# Patient Record
Sex: Male | Born: 1980 | Race: White | Hispanic: No | Marital: Single | Smoking: Never smoker
Health system: Southern US, Community
[De-identification: ages and names within clinical notes are randomized; demographics above are authoritative.]

## PROBLEM LIST (undated history)

## (undated) HISTORY — PX: BUNIONECTOMY: SHX129

## (undated) HISTORY — PX: HERNIA REPAIR: SHX51

---

## 2009-08-30 ENCOUNTER — Ambulatory Visit (HOSPITAL_BASED_OUTPATIENT_CLINIC_OR_DEPARTMENT_OTHER): Admission: RE | Admit: 2009-08-30 | Discharge: 2009-08-30 | Payer: Self-pay | Admitting: Orthopedic Surgery

## 2010-03-22 ENCOUNTER — Ambulatory Visit (HOSPITAL_BASED_OUTPATIENT_CLINIC_OR_DEPARTMENT_OTHER): Admission: RE | Admit: 2010-03-22 | Discharge: 2010-03-22 | Payer: Self-pay | Admitting: General Surgery

## 2011-02-07 LAB — POCT HEMOGLOBIN-HEMACUE: Hemoglobin: 15.6 g/dL (ref 13.0–17.0)

## 2012-09-05 ENCOUNTER — Ambulatory Visit: Payer: BC Managed Care – PPO

## 2012-10-21 ENCOUNTER — Emergency Department (HOSPITAL_BASED_OUTPATIENT_CLINIC_OR_DEPARTMENT_OTHER)
Admission: EM | Admit: 2012-10-21 | Discharge: 2012-10-21 | Disposition: A | Discharge status: Home / Self Care | Payer: Self-pay | Attending: Emergency Medicine | Admitting: Emergency Medicine

## 2012-10-21 ENCOUNTER — Emergency Department (HOSPITAL_BASED_OUTPATIENT_CLINIC_OR_DEPARTMENT_OTHER): Payer: Self-pay

## 2012-10-21 ENCOUNTER — Encounter (HOSPITAL_BASED_OUTPATIENT_CLINIC_OR_DEPARTMENT_OTHER): Payer: Self-pay | Admitting: *Deleted

## 2012-10-21 DIAGNOSIS — S8000XA Contusion of unspecified knee, initial encounter: Secondary | ICD-10-CM | POA: Insufficient documentation

## 2012-10-21 DIAGNOSIS — S8001XA Contusion of right knee, initial encounter: Secondary | ICD-10-CM

## 2012-10-21 DIAGNOSIS — S7011XA Contusion of right thigh, initial encounter: Secondary | ICD-10-CM

## 2012-10-21 DIAGNOSIS — Y929 Unspecified place or not applicable: Secondary | ICD-10-CM | POA: Insufficient documentation

## 2012-10-21 DIAGNOSIS — S7010XA Contusion of unspecified thigh, initial encounter: Secondary | ICD-10-CM | POA: Insufficient documentation

## 2012-10-21 DIAGNOSIS — Y939 Activity, unspecified: Secondary | ICD-10-CM | POA: Insufficient documentation

## 2012-10-21 DIAGNOSIS — X58XXXA Exposure to other specified factors, initial encounter: Secondary | ICD-10-CM | POA: Insufficient documentation

## 2012-10-21 MED ORDER — HYDROCODONE-ACETAMINOPHEN 5-325 MG PO TABS: 2.0000 | ORAL_TABLET | ORAL | Status: DC | PRN

## 2012-10-21 MED ORDER — IBUPROFEN 800 MG PO TABS: 800.0000 mg | ORAL_TABLET | Freq: Three times a day (TID) | ORAL | Status: DC
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
## 2012-10-25 ENCOUNTER — Encounter: Payer: Self-pay | Admitting: Family Medicine

## 2012-10-25 ENCOUNTER — Ambulatory Visit (INDEPENDENT_AMBULATORY_CARE_PROVIDER_SITE_OTHER): Payer: Self-pay | Admitting: Family Medicine

## 2012-10-25 ENCOUNTER — Ambulatory Visit (HOSPITAL_BASED_OUTPATIENT_CLINIC_OR_DEPARTMENT_OTHER)
Admission: RE | Admit: 2012-10-25 | Discharge: 2012-10-25 | Disposition: A | Discharge status: Home / Self Care | Payer: Self-pay | Source: Ambulatory Visit | Attending: Family Medicine | Admitting: Family Medicine

## 2012-10-25 VITALS — BP 142/91 | Ht 67.0 in | Wt 160.0 lb

## 2012-10-25 DIAGNOSIS — S8991XA Unspecified injury of right lower leg, initial encounter: Secondary | ICD-10-CM

## 2012-10-25 DIAGNOSIS — S99929A Unspecified injury of unspecified foot, initial encounter: Secondary | ICD-10-CM

## 2012-10-25 DIAGNOSIS — M25551 Pain in right hip: Secondary | ICD-10-CM

## 2012-10-25 DIAGNOSIS — M25559 Pain in unspecified hip: Secondary | ICD-10-CM

## 2012-10-25 DIAGNOSIS — S8990XA Unspecified injury of unspecified lower leg, initial encounter: Secondary | ICD-10-CM

## 2012-10-25 MED ORDER — HYDROCODONE-ACETAMINOPHEN 5-325 MG PO TABS: 1.0000 | ORAL_TABLET | Freq: Four times a day (QID) | ORAL | Status: DC | PRN

## 2012-10-25 NOTE — Patient Instructions (Addendum)
I'm concerned you at least tore the meniscus in your right knee and possibly tore your ACL as well. We will go ahead with an MRI of your knee as soon as the cone coverage goes through - call us when it does. In the meantime, elevate, ice 15 minutes at a time 3-4 times a day, wrap with ACE wrap for compression. Knee immobilizer only if needed. Start straight leg raises 3 sets of 10 once a day. Ibuprofen 600mg  three times a day with food for pain and inflammation. Hydrocodone as needed for severe pain beyond this. Call me when the cone coverage goes through (usually 1-2 weeks) and I'll order the MRI.

## 2012-10-25 NOTE — ED Provider Notes (Signed)
Medical screening examination/treatment/procedure(s) were performed by non-physician practitioner and as supervising physician I was immediately available for consultation/collaboration.  Hurman Horn, MD 10/25/12 [phone]

## 2012-10-28 ENCOUNTER — Ambulatory Visit: Payer: Self-pay

## 2012-10-30 ENCOUNTER — Encounter: Payer: Self-pay | Admitting: Family Medicine

## 2012-10-30 DIAGNOSIS — S8991XA Unspecified injury of right lower leg, initial encounter: Secondary | ICD-10-CM | POA: Insufficient documentation

## 2012-10-30 HISTORY — DX: Unspecified injury of right lower leg, initial encounter: S89.91XA

## 2012-10-30 NOTE — Assessment & Plan Note (Signed)
hip radiographs negative as well.  Primary injury is to his right knee where he has an effusion, evidence of medial meniscal tear as well as a probable ACL tear.  Care is limited by lack of insurance - is applying for cone coverage so we can consider MRI.  In meantime, icing, norco, ibuprofen, ace wrap and elevation for swelling.  Start home quad strengthening exercises.    Addendum:  Patient has 65% cone coverage - asked about going ahead with MRI but patient states cost would be too great even with that coverage.  He is going to come back in at 3 weeks for follow-up for reevaluation.

## 2012-10-30 NOTE — Addendum Note (Signed)
Addended by: Kathi Simpers F on: 10/30/2012 03:16 PM   Modules accepted: Orders
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
## 2012-11-02 ENCOUNTER — Ambulatory Visit (HOSPITAL_BASED_OUTPATIENT_CLINIC_OR_DEPARTMENT_OTHER)
Admission: RE | Admit: 2012-11-02 | Discharge: 2012-11-02 | Disposition: A | Discharge status: Home / Self Care | Payer: PRIVATE HEALTH INSURANCE | Source: Ambulatory Visit | Attending: Family Medicine | Admitting: Family Medicine

## 2012-11-02 DIAGNOSIS — S8991XA Unspecified injury of right lower leg, initial encounter: Secondary | ICD-10-CM

## 2012-11-02 DIAGNOSIS — IMO0002 Reserved for concepts with insufficient information to code with codable children: Secondary | ICD-10-CM | POA: Insufficient documentation

## 2012-11-02 DIAGNOSIS — S83509A Sprain of unspecified cruciate ligament of unspecified knee, initial encounter: Secondary | ICD-10-CM | POA: Insufficient documentation

## 2012-11-02 DIAGNOSIS — M25569 Pain in unspecified knee: Secondary | ICD-10-CM | POA: Insufficient documentation

## 2012-11-04 ENCOUNTER — Encounter: Payer: Self-pay | Admitting: Family Medicine

## 2012-11-04 ENCOUNTER — Ambulatory Visit (INDEPENDENT_AMBULATORY_CARE_PROVIDER_SITE_OTHER): Payer: PRIVATE HEALTH INSURANCE | Admitting: Family Medicine

## 2012-11-04 VITALS — BP 127/93 | HR 84 | Ht 68.0 in | Wt 155.0 lb

## 2012-11-04 DIAGNOSIS — S8990XA Unspecified injury of unspecified lower leg, initial encounter: Secondary | ICD-10-CM

## 2012-11-04 DIAGNOSIS — S8991XA Unspecified injury of right lower leg, initial encounter: Secondary | ICD-10-CM

## 2012-11-04 NOTE — Patient Instructions (Addendum)
You have partially torn the PCL of your right knee. After 4-6 weeks you should feel close to 100% as the swelling goes down. Almost everyone does well with the conservative approach to treating this. Ice 15 minutes at a time 3-4 times a day. Knee brace for support and swelling. Elevate leg as much as possible. Ibuprofen OR aleve for pain and inflammation. Start 1-2 visits of physical therapy to get a more extensive physical therapy program. Follow up with me in 2 weeks - hopefully we can return you to work at that time.

## 2012-11-05 ENCOUNTER — Encounter: Payer: Self-pay | Admitting: Family Medicine
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
## 2012-11-05 NOTE — Assessment & Plan Note (Signed)
Patient's MRI is consistent with a partial PCL tear which would explain laxity in AP direction.  This is only partial and no other intraarticular injuries so he should do well with conservative approach.  Start formal PT and a more rigorous home exercise program.  Continue icing, ibuprofen, norco, ace wrap, elevation.  F/u in 2 weeks for reevaluation - hopefully can return him to light duty at that time.

## 2012-11-07 ENCOUNTER — Ambulatory Visit: Payer: PRIVATE HEALTH INSURANCE | Attending: Family Medicine | Admitting: Rehabilitation

## 2012-11-07 DIAGNOSIS — M25569 Pain in unspecified knee: Secondary | ICD-10-CM | POA: Insufficient documentation

## 2012-11-07 DIAGNOSIS — M25669 Stiffness of unspecified knee, not elsewhere classified: Secondary | ICD-10-CM | POA: Insufficient documentation

## 2012-11-07 DIAGNOSIS — R269 Unspecified abnormalities of gait and mobility: Secondary | ICD-10-CM | POA: Insufficient documentation

## 2012-11-07 DIAGNOSIS — IMO0001 Reserved for inherently not codable concepts without codable children: Secondary | ICD-10-CM | POA: Insufficient documentation

## 2012-11-15 ENCOUNTER — Ambulatory Visit: Payer: PRIVATE HEALTH INSURANCE | Admitting: Rehabilitation

## 2012-11-18 ENCOUNTER — Ambulatory Visit: Payer: PRIVATE HEALTH INSURANCE | Admitting: Family Medicine

## 2012-11-19 ENCOUNTER — Ambulatory Visit: Payer: PRIVATE HEALTH INSURANCE | Admitting: Rehabilitation

## 2012-11-21 ENCOUNTER — Encounter: Payer: Self-pay | Admitting: Family Medicine

## 2012-11-21 ENCOUNTER — Ambulatory Visit (INDEPENDENT_AMBULATORY_CARE_PROVIDER_SITE_OTHER): Payer: PRIVATE HEALTH INSURANCE | Admitting: Family Medicine

## 2012-11-21 ENCOUNTER — Ambulatory Visit: Payer: PRIVATE HEALTH INSURANCE | Attending: Family Medicine | Admitting: Rehabilitation

## 2012-11-21 VITALS — BP 119/88 | HR 93 | Ht 68.0 in | Wt 150.0 lb

## 2012-11-21 DIAGNOSIS — S99919A Unspecified injury of unspecified ankle, initial encounter: Secondary | ICD-10-CM

## 2012-11-21 DIAGNOSIS — S8990XA Unspecified injury of unspecified lower leg, initial encounter: Secondary | ICD-10-CM

## 2012-11-21 DIAGNOSIS — S8991XA Unspecified injury of right lower leg, initial encounter: Secondary | ICD-10-CM

## 2012-11-21 DIAGNOSIS — R269 Unspecified abnormalities of gait and mobility: Secondary | ICD-10-CM | POA: Insufficient documentation

## 2012-11-21 DIAGNOSIS — IMO0001 Reserved for inherently not codable concepts without codable children: Secondary | ICD-10-CM | POA: Insufficient documentation

## 2012-11-21 DIAGNOSIS — M25569 Pain in unspecified knee: Secondary | ICD-10-CM | POA: Insufficient documentation

## 2012-11-21 DIAGNOSIS — M25669 Stiffness of unspecified knee, not elsewhere classified: Secondary | ICD-10-CM | POA: Insufficient documentation

## 2012-11-21 NOTE — Patient Instructions (Addendum)
You are doing much better from your right knee PCL partial tear as expected. Start back on light duty for 1 week at work then return to full duty. Continue with physical therapy in the meantime. Follow up with me in 4 weeks - if you're doing well, this will be your last visit.

## 2012-11-22 ENCOUNTER — Encounter: Payer: Self-pay | Admitting: Family Medicine

## 2012-11-22 NOTE — Assessment & Plan Note (Signed)
2/2 partial PCL tear.  Doing extremely well with conservative care as expected.  Will continue with PT.  Tylenol, nsaids as needed.  Start light duty at work - no lifting more than 15 pounds, no crawling, stooping for 1 week then cleared for full duty.  Follow up with me in 4 weeks, call with any concerns sooner.
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
## 2012-11-28 ENCOUNTER — Ambulatory Visit: Payer: PRIVATE HEALTH INSURANCE | Admitting: Rehabilitation

## 2012-12-18 ENCOUNTER — Encounter: Payer: Self-pay | Admitting: Family Medicine

## 2012-12-18 ENCOUNTER — Ambulatory Visit: Payer: PRIVATE HEALTH INSURANCE | Admitting: Family Medicine

## 2012-12-18 ENCOUNTER — Ambulatory Visit (INDEPENDENT_AMBULATORY_CARE_PROVIDER_SITE_OTHER): Payer: PRIVATE HEALTH INSURANCE | Admitting: Family Medicine

## 2012-12-18 VITALS — BP 133/88 | HR 77 | Ht 68.0 in | Wt 155.0 lb

## 2012-12-18 DIAGNOSIS — S99929A Unspecified injury of unspecified foot, initial encounter: Secondary | ICD-10-CM

## 2012-12-18 DIAGNOSIS — S8990XA Unspecified injury of unspecified lower leg, initial encounter: Secondary | ICD-10-CM

## 2012-12-18 DIAGNOSIS — S8991XA Unspecified injury of right lower leg, initial encounter: Secondary | ICD-10-CM

## 2012-12-19 ENCOUNTER — Encounter: Payer: Self-pay | Admitting: Family Medicine
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
## 2012-12-19 NOTE — Assessment & Plan Note (Signed)
2/2 partial PCL tear.  Believe he's at maximum medical improvement with excellent clinical improvement.  Return to full duty now.  F/u prn and call with any concerns.

## 2013-01-04 ENCOUNTER — Other Ambulatory Visit: Payer: Self-pay

## 2013-09-25 ENCOUNTER — Other Ambulatory Visit: Payer: Self-pay

## 2015-03-12 ENCOUNTER — Emergency Department (HOSPITAL_COMMUNITY): Payer: No Typology Code available for payment source

## 2015-03-12 ENCOUNTER — Encounter (HOSPITAL_COMMUNITY): Payer: Self-pay | Admitting: Emergency Medicine

## 2015-03-12 ENCOUNTER — Emergency Department (HOSPITAL_COMMUNITY)
Admission: EM | Admit: 2015-03-12 | Discharge: 2015-03-12 | Disposition: A | Discharge status: Home / Self Care | Payer: No Typology Code available for payment source | Attending: Emergency Medicine | Admitting: Emergency Medicine

## 2015-03-12 DIAGNOSIS — Y998 Other external cause status: Secondary | ICD-10-CM | POA: Diagnosis not present

## 2015-03-12 DIAGNOSIS — Y92410 Unspecified street and highway as the place of occurrence of the external cause: Secondary | ICD-10-CM | POA: Diagnosis not present

## 2015-03-12 DIAGNOSIS — Y9389 Activity, other specified: Secondary | ICD-10-CM | POA: Insufficient documentation

## 2015-03-12 DIAGNOSIS — S199XXA Unspecified injury of neck, initial encounter: Secondary | ICD-10-CM | POA: Diagnosis present

## 2015-03-12 DIAGNOSIS — M542 Cervicalgia: Secondary | ICD-10-CM

## 2015-03-12 DIAGNOSIS — S0990XD Unspecified injury of head, subsequent encounter: Secondary | ICD-10-CM | POA: Diagnosis not present

## 2015-03-12 DIAGNOSIS — R52 Pain, unspecified: Secondary | ICD-10-CM

## 2015-03-12 MED ORDER — IBUPROFEN 200 MG PO TABS
600.0000 mg | ORAL_TABLET | Freq: Once | ORAL | Status: AC
Start: 2015-03-12 — End: 2015-03-12
  Administered 2015-03-12: 600 mg via ORAL
  Filled 2015-03-12: qty 3

## 2015-03-12 MED ORDER — IBUPROFEN 800 MG PO TABS: 800.0000 mg | ORAL_TABLET | Freq: Three times a day (TID) | ORAL | Status: DC | PRN

## 2015-03-12 MED ORDER — CYCLOBENZAPRINE HCL 10 MG PO TABS: 10.0000 mg | ORAL_TABLET | Freq: Three times a day (TID) | ORAL | Status: DC | PRN
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
## 2017-11-23 ENCOUNTER — Telehealth: Payer: Self-pay | Admitting: General Practice

## 2017-11-23 NOTE — Telephone Encounter (Signed)
Copied from CRM [phone]. Topic: Quick Communication - See Telephone Encounter >> Nov 23, 2017  4:54 PM Rudi CocoLathan, Jeananne Bedwell M, NT wrote: CRM for notification. See Telephone encounter for:   11/23/17. Pt. Called wanting to speak with [facility] transferred him to vm pt. Hung up. Called pt. Back to transfer him to NT but no answer.

## 2017-11-23 NOTE — Telephone Encounter (Signed)
I am sorry I don't know what the abbreviation for BHP and NT mean.

## 2017-12-27 ENCOUNTER — Ambulatory Visit: Payer: Self-pay | Admitting: Psychology

## 2021-03-07 ENCOUNTER — Ambulatory Visit (HOSPITAL_BASED_OUTPATIENT_CLINIC_OR_DEPARTMENT_OTHER): Payer: PRIVATE HEALTH INSURANCE | Admitting: Nurse Practitioner

## 2021-03-14 ENCOUNTER — Other Ambulatory Visit: Payer: Self-pay

## 2021-03-14 ENCOUNTER — Emergency Department (HOSPITAL_BASED_OUTPATIENT_CLINIC_OR_DEPARTMENT_OTHER): Payer: BC Managed Care – PPO | Admitting: Radiology

## 2021-03-14 ENCOUNTER — Ambulatory Visit (INDEPENDENT_AMBULATORY_CARE_PROVIDER_SITE_OTHER): Payer: BC Managed Care – PPO | Admitting: Nurse Practitioner

## 2021-03-14 ENCOUNTER — Emergency Department (HOSPITAL_BASED_OUTPATIENT_CLINIC_OR_DEPARTMENT_OTHER)
Admission: EM | Admit: 2021-03-14 | Discharge: 2021-03-14 | Disposition: A | Discharge status: Home / Self Care | Payer: BC Managed Care – PPO | Attending: Emergency Medicine | Admitting: Emergency Medicine

## 2021-03-14 ENCOUNTER — Encounter (HOSPITAL_BASED_OUTPATIENT_CLINIC_OR_DEPARTMENT_OTHER): Payer: Self-pay | Admitting: Nurse Practitioner

## 2021-03-14 ENCOUNTER — Other Ambulatory Visit (HOSPITAL_BASED_OUTPATIENT_CLINIC_OR_DEPARTMENT_OTHER)
Admission: RE | Admit: 2021-03-14 | Discharge: 2021-03-14 | Disposition: A | Discharge status: Home / Self Care | Payer: BC Managed Care – PPO | Source: Ambulatory Visit | Attending: Nurse Practitioner | Admitting: Nurse Practitioner

## 2021-03-14 ENCOUNTER — Encounter (HOSPITAL_BASED_OUTPATIENT_CLINIC_OR_DEPARTMENT_OTHER): Payer: Self-pay | Admitting: Emergency Medicine

## 2021-03-14 VITALS — BP 127/97 | HR 63 | Ht 67.0 in | Wt 137.0 lb

## 2021-03-14 DIAGNOSIS — R079 Chest pain, unspecified: Secondary | ICD-10-CM | POA: Diagnosis not present

## 2021-03-14 DIAGNOSIS — Z9109 Other allergy status, other than to drugs and biological substances: Secondary | ICD-10-CM

## 2021-03-14 DIAGNOSIS — Z0000 Encounter for general adult medical examination without abnormal findings: Secondary | ICD-10-CM

## 2021-03-14 DIAGNOSIS — R058 Other specified cough: Secondary | ICD-10-CM

## 2021-03-14 DIAGNOSIS — J9801 Acute bronchospasm: Secondary | ICD-10-CM

## 2021-03-14 DIAGNOSIS — Z7689 Persons encountering health services in other specified circumstances: Secondary | ICD-10-CM | POA: Diagnosis not present

## 2021-03-14 DIAGNOSIS — R55 Syncope and collapse: Secondary | ICD-10-CM

## 2021-03-14 DIAGNOSIS — R059 Cough, unspecified: Secondary | ICD-10-CM | POA: Diagnosis not present

## 2021-03-14 DIAGNOSIS — R0789 Other chest pain: Secondary | ICD-10-CM | POA: Diagnosis not present

## 2021-03-14 DIAGNOSIS — R053 Chronic cough: Secondary | ICD-10-CM | POA: Diagnosis not present

## 2021-03-14 LAB — COMPREHENSIVE METABOLIC PANEL
ALT: 17 U/L (ref 0–44)
AST: 21 U/L (ref 15–41)
Albumin: 4.7 g/dL (ref 3.5–5.0)
Alkaline Phosphatase: 55 U/L (ref 38–126)
Anion gap: 3 — ABNORMAL LOW (ref 5–15)
BUN: 19 mg/dL (ref 6–20)
CO2: 33 mmol/L — ABNORMAL HIGH (ref 22–32)
Calcium: 9.8 mg/dL (ref 8.9–10.3)
Chloride: 101 mmol/L (ref 98–111)
Creatinine, Ser: 0.85 mg/dL (ref 0.61–1.24)
GFR, Estimated: >60 mL/min (ref >60)
Glucose, Bld: 102 mg/dL — ABNORMAL HIGH (ref 70–99)
Potassium: 4 mmol/L (ref 3.5–5.1)
Sodium: 137 mmol/L (ref 135–145)
Total Bilirubin: 0.6 mg/dL (ref 0.3–1.2)
Total Protein: 7.5 g/dL (ref 6.5–8.1)

## 2021-03-14 LAB — CBC WITH DIFFERENTIAL/PLATELET
Abs Immature Granulocytes: 0.01 10*3/uL (ref 0.00–0.07)
Basophils Absolute: 0 10*3/uL (ref 0.0–0.1)
Basophils Relative: 1 %
Eosinophils Absolute: 0.1 10*3/uL (ref 0.0–0.5)
Eosinophils Relative: 2 %
HCT: 42.7 % (ref 39.0–52.0)
Hemoglobin: 14.4 g/dL (ref 13.0–17.0)
Immature Granulocytes: 0 %
Lymphocytes Relative: 39 %
Lymphs Abs: 1.3 10*3/uL (ref 0.7–4.0)
MCH: 30.6 pg (ref 26.0–34.0)
MCHC: 33.7 g/dL (ref 30.0–36.0)
MCV: 90.7 fL (ref 80.0–100.0)
Monocytes Absolute: 0.3 10*3/uL (ref 0.1–1.0)
Monocytes Relative: 8 %
Neutro Abs: 1.7 10*3/uL (ref 1.7–7.7)
Neutrophils Relative %: 50 %
Platelets: 228 10*3/uL (ref 150–400)
RBC: 4.71 MIL/uL (ref 4.22–5.81)
RDW: 12.8 % (ref 11.5–15.5)
WBC: 3.4 10*3/uL — ABNORMAL LOW (ref 4.0–10.5)
nRBC: 0 % (ref 0.0–0.2)

## 2021-03-14 LAB — LIPID PANEL
Cholesterol: 209 mg/dL — ABNORMAL HIGH (ref 0–200)
HDL: 88 mg/dL (ref >40)
LDL Cholesterol: 104 mg/dL — ABNORMAL HIGH (ref 0–99)
Total CHOL/HDL Ratio: 2.4 RATIO
Triglycerides: 84 mg/dL (ref <150)
VLDL: 17 mg/dL (ref 0–40)

## 2021-03-14 MED ORDER — MONTELUKAST SODIUM 10 MG PO TABS: 10.0000 mg | ORAL_TABLET | Freq: Every day | ORAL | 3 refills | Status: DC

## 2021-03-14 MED ORDER — ALBUTEROL SULFATE HFA 108 (90 BASE) MCG/ACT IN AERS: 1.0000 | INHALATION_SPRAY | RESPIRATORY_TRACT | 1 refills | Status: DC | PRN

## 2021-03-14 MED ORDER — FLUTICASONE PROPIONATE 50 MCG/ACT NA SUSP: 2.0000 | Freq: Every day | NASAL | 0 refills | Status: DC

## 2021-03-14 NOTE — Assessment & Plan Note (Signed)
Review of medical history, social history, and family history performed.  Unsure on vaccination status for flu and tdap- will review medical records to determine needs.  Acute issues addressed today.  CPE due with labs. Will obtain lab panel for evaluation and review at upcoming CPE.

## 2021-03-14 NOTE — Assessment & Plan Note (Signed)
Cough in the setting of suspected allergies and reactive airway disease.  Treatment for allergens and asthma symptoms provided.  If no improvement, consider chest x-ray and further evaluation.  Very unlikely he will be able to leave the home.  Protective measures recommended during clean up process and air out of the home recommended.  Will follow-up in 2-4 weeks for re-evaluation.

## 2021-03-14 NOTE — Assessment & Plan Note (Signed)
Symptoms and presentation consistent with suspected reactive airway disorder that started shortly after moving into older home with known mold issues.  Strongly suspect symptoms are related to allergens and mold exposure.  At this time will start treatment with albuterol PRN and montelukast for reactive airway and allergy symptoms.  Recommend closing off area with mold present and wearing protective masks when working around areas with mold growth. It is not likely that he will be able to leave this environment at this time, therefore, hopeful that symptoms can be controlled with treatment.  No signs of infection present and no concerns for respiratory dysfunction.  Very mild wheeze auscultate in RLL on expiration with clear lung sounds otherwise.  Presence of cobblestoning and drainage in posterior pharynx solidifies suspicions of allergy.  Recommend follow-up in 2-4 weeks.

## 2021-03-14 NOTE — ED Notes (Signed)
Patient verbalizes understanding of discharge instructions. Opportunity for questioning and answers were provided. Armband removed by staff, pt discharged from ED.  

## 2021-03-14 NOTE — Assessment & Plan Note (Addendum)
Strong suspicion of allergies related to home environment with known mold presence (currently working to remove) and recent removal of old carpeting.  It is highly unlikely that he will be able to leave the environment, therefore will work to reduce symptoms and protect him as much as possible until problems are eliminated.  No signs of infection or serious respiratory compromise present.  Start montelukast at bedtime and flonase once a day with PRN albuterol for shortness of breath and wheezing.  Recommend protective mask when working with mold and keeping the area closed off, if possible. Suggest opening the window on clear days in the room to allow for air flow to help clear some contaminants.  No central heat and hair to spread contaminants into other areas at this time.  If symptoms do not improve or worsen, recommend alternative living environment to see if symptoms clear to be sure that the home is what is causing the issues.  Chest x-ray in 2-4 weeks if symptoms not improved. Labs today.  Recommend follow-up in 2-4 weeks.

## 2021-03-14 NOTE — Discharge Instructions (Signed)
Recommend following up with your primary care doctor.  If you have any additional episodes of passing out, any chest pain or difficulty breathing, or other new concerning symptom, return to ER for reassessment.

## 2021-03-14 NOTE — Patient Instructions (Addendum)
Recommendations from today's visit:  Your cough is most likely from something called Reactive Airway Disease which is related to allergies and asthma and causes increased cough and spasms in your airways.  I have called in medicine for you to start taking every day to see if this will help.  You will take the montelukast (Singulair) at bedtime every night. Make sure to take this before you go to sleep, because it can make you sleepy.  You will use 2 sprays of fluticasone (Flonase) in each nostril every day. You can do this in the morning or the evening, whichever is better for you.  You will use the inhaler if you feel short of breath or like your lungs are wheezing. You can use this up to every 4 hours, but only use it if you need it.   I want to get some lab work today to look at your white blood counts and make sure they are OK. I will also check your cholesterol and kidney function, liver function, and electrolytes.   I would like you to come back in 4 weeks- this medication can take about that long to give you the full effect. In 4 weeks we will do a physical exam and see how you are feeling and if the medication is working. If it is not, then we will plan to get an X-ray of your chest.    ____________________________________________ Thank you for choosing [facility] for your Primary Care needs. I am excited for the opportunity to partner with you to meet your health care goals. It was a pleasure meeting you today!  I am an Adult-Geriatric Nurse Practitioner with a background in caring for patients for more than 20 years. I received my Paediatric nurse in Nursing and my Doctor of Nursing Practice degrees at [employer]. I received additional fellowship training in primary care and sports medicine after receiving my doctorate degree. I provide primary care and sports medicine services to patients age 81 and older within this office. I am also a provider with the  [facility] and the director of the APP Fellowship with [facility].  I am a [place] native, but have called the [place] area home for nearly 20 years and am proud to be a member of this community.   I am passionate about providing the best service to you through preventive medicine and supportive care. I consider you a part of the medical team and value your input. I work diligently to ensure that you are heard and your needs are met in a safe and effective manner. I want you to feel comfortable with me as your provider and want you to know that your health concerns are important to me.   For your information, our office hours are Monday- Friday 8:00 AM - 5:00 PM At this time I am not in the office on Wednesdays.  If you have questions or concerns, please call our office at [phone] or send [place] a MyChart message and we will respond as quickly as possible.   For all urgent or time sensitive needs we ask that you please call the office to avoid delays. MyChart is not constantly monitored and replies may take up to 72 business hours.  MyChart Policy: . MyChart allows for you to see your visit notes, after visit summary, provider recommendations, lab and tests results, make an appointment, request refills, and contact your provider or the office  for non-urgent questions or concerns.  . Providers are seeing patients during normal business hours and do not have built in time to review MyChart messages. We ask that you allow a minimum of 72 business hours for MyChart message responses.  . Complex MyChart concerns may require a visit. Your provider may request you schedule a virtual or in person visit to ensure we are providing the best care possible. . MyChart messages sent after 4:00 PM on Friday will not be received by the provider until Monday morning.    Lab and Test Results: . You will receive your lab and test results on MyChart as soon as  they are completed and results have been sent by the lab or testing facility. Due to this service, you will receive your results BEFORE your provider.  . Please allow a minimum of 72 business hours for your provider to receive and review lab and test results and contact you about.   . Most lab and test result comments from the provider will be sent through [facility]. Your provider may recommend changes to the plan of care, follow-up visits, repeat testing, ask questions, or request an office visit to discuss these results. You may reply directly to this message or call the office at [phone] to provide information for the provider or set up an appointment. . In some instances, you will be called with test results and recommendations. Please let us know if this is preferred and we will make note of this in your chart to provide this for you.    . If you have not heard a response to your lab or test results in 72 business hours, please call the office to let us know.   After Hours: . For all non-emergency after hours needs, please call the office at [phone] and select the option to reach the on-call provider service. On-call services are shared between multiple [facility] offices and therefore it will not be possible to speak directly with your provider. On-call providers may provide medical advice and recommendations, but are unable to provide refills for maintenance medications.  . For all emergency or urgent medical needs after normal business hours, we recommend that you seek care at the closest Urgent Care or Emergency Department to ensure appropriate treatment in a timely manner.  [facility] has a 24 hour emergency room located on the ground floor for your convenience.    Please do not hesitate to reach out to [place] with concerns.   Thank you, again, for choosing me as your health care partner. I appreciate your trust and look forward to learning more about you.    Worthy Keeler, DNP, AGNP-c ___________________________________________________________________________________

## 2021-03-14 NOTE — ED Provider Notes (Signed)
[facility] EMERGENCY DEPT Provider Note   CSN: [id number] Arrival date & time: 03/14/21  1458     History Chief Complaint  Patient presents with  . Chest Pain    Victor King is a 40 y.o. male.  Presents to ER after near syncopal episode.  Patient reports that he was at his primary care office to establish care and to discuss chronic cough.  While obtaining blood work, he states that he felt very lightheaded and almost passed out.  States that these symptoms relatively quickly resolved and he has no ongoing symptoms at present.  Reports that his cough is chronic, nonproductive, no recent changes.  No fevers or chills.  No associated chest pain or difficulty in breathing.  Denies prior episodes of passing out.  Non-smoker.  HPI     History reviewed. No pertinent past medical history.  Patient Active Problem List   Diagnosis Date Noted  . Encounter to establish care 03/14/2021  . Allergic cough 03/14/2021  . Bronchospasm 03/14/2021  . Allergy to environmental factors 03/14/2021  . Right leg injury 10/30/2012  . Right knee injury 10/30/2012    Past Surgical History:  Procedure Laterality Date  . BUNIONECTOMY    . HERNIA REPAIR         Family History  Problem Relation Age of Onset  . Sudden death Neg Hx   . Hypertension Neg Hx   . Hyperlipidemia Neg Hx   . Heart attack Neg Hx   . Diabetes Neg Hx     Social History   Tobacco Use  . Smoking status: Never Smoker  . Smokeless tobacco: Never Used  Substance Use Topics  . Alcohol use: No  . Drug use: No    Home Medications Prior to Admission medications   Medication Sig Start Date End Date Taking? Authorizing Provider  albuterol (VENTOLIN HFA) 108 (90 Base) MCG/ACT inhaler Inhale 1-2 puffs into the lungs every 4 (four) hours as needed for wheezing. 03/14/21   Early, Sung Amabile, NP  fluticasone (FLONASE) 50 MCG/ACT nasal spray Place 2 sprays into both nostrils daily. 03/14/21   Tollie Eth, NP   montelukast (SINGULAIR) 10 MG tablet Take 1 tablet (10 mg total) by mouth at bedtime. 03/14/21   Tollie Eth, NP    Allergies    Patient has no known allergies.  Review of Systems   Review of Systems  Constitutional: Negative for chills and fever.  HENT: Negative for ear pain and sore throat.   Eyes: Negative for pain and visual disturbance.  Respiratory: Positive for cough. Negative for shortness of breath.   Cardiovascular: Negative for chest pain and palpitations.  Gastrointestinal: Negative for abdominal pain and vomiting.  Genitourinary: Negative for dysuria and hematuria.  Musculoskeletal: Negative for arthralgias and back pain.  Skin: Negative for color change and rash.  Neurological: Positive for syncope. Negative for seizures.  All other systems reviewed and are negative.   Physical Exam Updated Vital Signs BP (!) 133/91 (BP Location: Left Arm)   Pulse (!) 58   Temp 98.5 F (36.9 C) (Oral)   Resp 20   Ht 5\' 7"  (1.702 m)   Wt 62.1 kg   SpO2 100%   BMI 21.44 kg/m   Physical Exam Vitals and nursing note reviewed.  Constitutional:      Appearance: He is well-developed.  HENT:     Head: Normocephalic and atraumatic.  Eyes:     Conjunctiva/sclera: Conjunctivae normal.  Cardiovascular:  Rate and Rhythm: Normal rate and regular rhythm.     Heart sounds: No murmur heard.   Pulmonary:     Effort: Pulmonary effort is normal. No respiratory distress.     Breath sounds: Normal breath sounds.  Abdominal:     Palpations: Abdomen is soft.     Tenderness: There is no abdominal tenderness.  Musculoskeletal:     Cervical back: Neck supple.  Skin:    General: Skin is warm and dry.  Neurological:     Mental Status: He is alert.     ED Results / Procedures / Treatments   Labs (all labs ordered are listed, but only abnormal results are displayed) Labs Reviewed - No data to display  EKG EKG Interpretation  Date/Time:  Monday March 14 2021 17:01:56  EDT Ventricular Rate:  59 PR Interval:  152 QRS Duration: 106 QT Interval:  400 QTC Calculation: 396 R Axis:   74 Text Interpretation: Sinus bradycardia with sinus arrhythmia Minimal voltage criteria for LVH, may be normal variant ( Sokolow-Lyon ) Borderline ECG Confirmed by Marianna Fuss ([id number]) on 03/14/2021 5:13:24 PM   Radiology No results found.  Procedures Procedures   Medications Ordered in ED Medications - No data to display  ED Course  I have reviewed the triage vital signs and the nursing notes.  Pertinent labs & imaging results that were available during my care of the patient were reviewed by me and considered in my medical decision making (see chart for details).    MDM Rules/Calculators/A&P                          40 year old male presents to ER with concern for near syncopal episode while obtaining lab work.  On exam today patient is remarkably well-appearing in no distress with stable vital signs.  EKG unremarkable, basic labs that were performed earlier today were grossly normal.  CXR negative.  Recommend follow-up with primary doctor regarding this episode and his chronic cough.   After the discussed management above, the patient was determined to be safe for discharge.  The patient was in agreement with this plan and all questions regarding their care were answered.  ED return precautions were discussed and the patient will return to the ED with any significant worsening of condition.  Final Clinical Impression(s) / ED Diagnoses Final diagnoses:  Near syncope    Rx / DC Orders ED Discharge Orders    None       Milagros Loll, MD 03/14/21 1743

## 2021-03-14 NOTE — ED Triage Notes (Signed)
Pt from PCP on 3rd floor. He was seen for coughing/chest pain since November. During his visit, he was prescribed singular and flonase and sent for lab work. Pt had near syncopal episode during lab draw and was brought to the ED. Pt is clearing his throat every few seconds in triage. States he has no other symptoms. Pt alert & oriented, nad noted.

## 2021-03-14 NOTE — Progress Notes (Signed)
New Patient Office Visit  Subjective:  Patient ID: Victor King, male    DOB: 1981-02-22  Age: 40 y.o. MRN: [id number]  CC:  Chief Complaint  Patient presents with  . Establish Care    HPI Victor King presents to establish care with this practice.  He reports he has not seen a PCP since he was a child.   He states today that he has concerns with a "tickle in my throat" and cough and he needs to clear throat constantly that has been going on since about November. He reports in October of last year he moved into his fathers home after he passed away. He tells me that he and his brother removed old carpeting and did find an area of mold damage in the master bedroom. They are in the process of repairing the flooring and joists from the damage This is the only area of mold in the home that he is aware of. There is no central heat of air- oil space heaters utilized for heat in rooms and window unit for [facility]. They have not opened windows or had the home professionally evaluated for mold damage. They are doing the work themselves.    He endorses chronic constant cough, intermittent sore throat, intermittent shortness of breath, and a sensation of "back of my throat clogged". He denies fever, chills, sinus pain or pressure, ear pain or pressure, rhinorrhea, wheezing, gerd or reflux symptoms, allergies, congestion.   He lives with his brother. He works at [employer] in [place].  He denies etoh, nicotine, or recreational drug use.  He is not currently sexually active. Denies concerns for STI and declines testing today.  He denies any regular medication use or medial history. He reports he feels safe in his home and environment.  He states that his mother had heart disease and his father had cancer of unknown type. Both have passed away.  He has had his COVID vaccines, he is unsure about a flu vaccine or Tdap.    Past Surgical History:  Procedure Laterality Date  . BUNIONECTOMY    .  HERNIA REPAIR      ROS Review of Systems All review of systems negative except what is listed in the HPI  Objective:   Today's Vitals: BP (!) 127/97   Pulse 63   Ht 5\' 7"  (1.702 m)   Wt 137 lb (62.1 kg)   SpO2 100%   BMI 21.46 kg/m   Physical Exam Vitals and nursing note reviewed.  Constitutional:      Appearance: Normal appearance. He is normal weight.  HENT:     Head: Normocephalic and atraumatic.     Right Ear: Hearing, ear canal and external ear normal. A middle ear effusion is present. Tympanic membrane is retracted.     Left Ear: Hearing, ear canal and external ear normal. A middle ear effusion is present. Tympanic membrane is retracted.     Nose: Mucosal edema and congestion present.     Right Turbinates: Swollen and pale.     Left Turbinates: Swollen and pale.     Right Sinus: No maxillary sinus tenderness or frontal sinus tenderness.     Left Sinus: No maxillary sinus tenderness or frontal sinus tenderness.     Mouth/Throat:     Mouth: Mucous membranes are moist.     Pharynx: Posterior oropharyngeal erythema present.     Comments: Cobblestone appears to posterior pharynx with moderate amount of drainage present.  Eyes:  Extraocular Movements: Extraocular movements intact.     Conjunctiva/sclera: Conjunctivae normal.     Pupils: Pupils are equal, round, and reactive to light.  Neck:     Vascular: No carotid bruit.  Cardiovascular:     Rate and Rhythm: Normal rate and regular rhythm.     Pulses: Normal pulses.     Heart sounds: Normal heart sounds. No murmur heard.   Pulmonary:     Effort: Pulmonary effort is normal.     Breath sounds: Wheezing present.  Abdominal:     General: Abdomen is flat. Bowel sounds are normal. There is no distension.     Palpations: Abdomen is soft.     Tenderness: There is no abdominal tenderness. There is no right CVA tenderness, left CVA tenderness, guarding or rebound.  Musculoskeletal:        General: Normal range of  motion.     Cervical back: Normal range of motion and neck supple. No tenderness.     Right lower leg: No edema.     Left lower leg: No edema.  Lymphadenopathy:     Cervical: No cervical adenopathy.  Skin:    General: Skin is warm and dry.     Capillary Refill: Capillary refill takes less than 2 seconds.  Neurological:     General: No focal deficit present.     Mental Status: He is alert and oriented to person, place, and time. Mental status is at baseline.     Cranial Nerves: No cranial nerve deficit.     Sensory: No sensory deficit.     Motor: No weakness.     Coordination: Coordination normal.     Gait: Gait normal.     Deep Tendon Reflexes: Reflexes normal.  Psychiatric:        Behavior: Behavior normal.        Thought Content: Thought content normal.        Judgment: Judgment normal.       Assessment & Plan:   Problem List Items Addressed This Visit      Other   Encounter to establish care - Primary    Review of medical history, social history, and family history performed.  Unsure on vaccination status for flu and tdap- will review medical records to determine needs.  Acute issues addressed today.  CPE due with labs. Will obtain lab panel for evaluation and review at upcoming CPE.        Relevant Orders   Comprehensive metabolic panel (Completed)   Lipid panel   Allergic cough    Cough in the setting of suspected allergies and reactive airway disease.  Treatment for allergens and asthma symptoms provided.  If no improvement, consider chest x-ray and further evaluation.  Very unlikely he will be able to leave the home.  Protective measures recommended during clean up process and air out of the home recommended.  Will follow-up in 2-4 weeks for re-evaluation.       Relevant Medications   montelukast (SINGULAIR) 10 MG tablet   fluticasone (FLONASE) 50 MCG/ACT nasal spray   Other Relevant Orders   CBC with Differential (Completed)   Bronchospasm    Symptoms  and presentation consistent with suspected reactive airway disorder that started shortly after moving into older home with known mold issues.  Strongly suspect symptoms are related to allergens and mold exposure.  At this time will start treatment with albuterol PRN and montelukast for reactive airway and allergy symptoms.  Recommend closing off area with mold  present and wearing protective masks when working around areas with mold growth. It is not likely that he will be able to leave this environment at this time, therefore, hopeful that symptoms can be controlled with treatment.  No signs of infection present and no concerns for respiratory dysfunction.  Very mild wheeze auscultate in RLL on expiration with clear lung sounds otherwise.  Presence of cobblestoning and drainage in posterior pharynx solidifies suspicions of allergy.  Recommend follow-up in 2-4 weeks.      Relevant Medications   montelukast (SINGULAIR) 10 MG tablet   albuterol (VENTOLIN HFA) 108 (90 Base) MCG/ACT inhaler   Other Relevant Orders   CBC with Differential (Completed)   Allergy to environmental factors    Strong suspicion of allergies related to home environment with known mold presence (currently working to remove) and recent removal of old carpeting.  It is highly unlikely that he will be able to leave the environment, therefore will work to reduce symptoms and protect him as much as possible until problems are eliminated.  No signs of infection or serious respiratory compromise present.  Start montelukast at bedtime and flonase once a day with PRN albuterol for shortness of breath and wheezing.  Recommend protective mask when working with mold and keeping the area closed off, if possible. Suggest opening the window on clear days in the room to allow for air flow to help clear some contaminants.  No central heat and hair to spread contaminants into other areas at this time.  If symptoms do not improve or worsen,  recommend alternative living environment to see if symptoms clear to be sure that the home is what is causing the issues.  Chest x-ray in 2-4 weeks if symptoms not improved. Labs today.  Recommend follow-up in 2-4 weeks.       Relevant Orders   CBC with Differential (Completed)    Other Visit Diagnoses    Routine health maintenance       Relevant Orders   Comprehensive metabolic panel (Completed)   Lipid panel      Outpatient Encounter Medications as of 03/14/2021  Medication Sig  . albuterol (VENTOLIN HFA) 108 (90 Base) MCG/ACT inhaler Inhale 1-2 puffs into the lungs every 4 (four) hours as needed for wheezing.  . fluticasone (FLONASE) 50 MCG/ACT nasal spray Place 2 sprays into both nostrils daily.  . montelukast (SINGULAIR) 10 MG tablet Take 1 tablet (10 mg total) by mouth at bedtime.  . [DISCONTINUED] cyclobenzaprine (FLEXERIL) 10 MG tablet Take 1 tablet (10 mg total) by mouth 3 (three) times daily as needed for muscle spasms (and pain).  . [DISCONTINUED] ibuprofen (ADVIL,MOTRIN) 800 MG tablet Take 1 tablet (800 mg total) by mouth every 8 (eight) hours as needed for mild pain or moderate pain.   No facility-administered encounter medications on file as of 03/14/2021.    Follow-up: Return in about 4 weeks (around 04/11/2021) for CPE and Allergy F/U- Labs today.   Tollie Eth, NP

## 2021-03-17 NOTE — Progress Notes (Signed)
Cholesterol slightly elevated- HDL is very good. Recommend decrease saturated fats and include 20 minute brisk walks every day to help see if we can get this down a little.

## 2021-03-23 ENCOUNTER — Telehealth (HOSPITAL_BASED_OUTPATIENT_CLINIC_OR_DEPARTMENT_OTHER): Payer: Self-pay

## 2021-03-23 NOTE — Telephone Encounter (Signed)
Called patient to go over lab results. Patient did not answer and has a mailbox that is not set up and can not accept any messages. Will attempt to call patient later.

## 2021-03-23 NOTE — Telephone Encounter (Signed)
Called patient to go over lab results. Patient did not answer and has a mailbox that is not set up and can not accept any messages. Will attempt to call patient later.  

## 2021-03-23 NOTE — Telephone Encounter (Signed)
-----   Message from Tollie Eth, NP sent at 03/17/2021  8:09 AM EDT ----- Cholesterol slightly elevated- HDL is very good. Recommend decrease saturated fats and include 20 minute brisk walks every day to help see if we can get this down a little.

## 2021-04-11 ENCOUNTER — Ambulatory Visit (HOSPITAL_BASED_OUTPATIENT_CLINIC_OR_DEPARTMENT_OTHER): Payer: BC Managed Care – PPO | Admitting: Nurse Practitioner

## 2021-04-11 DIAGNOSIS — Z0000 Encounter for general adult medical examination without abnormal findings: Secondary | ICD-10-CM | POA: Insufficient documentation

## 2021-04-11 DIAGNOSIS — H6690 Otitis media, unspecified, unspecified ear: Secondary | ICD-10-CM

## 2021-04-11 DIAGNOSIS — H7290 Unspecified perforation of tympanic membrane, unspecified ear: Secondary | ICD-10-CM

## 2021-04-11 HISTORY — DX: Otitis media, unspecified, unspecified ear: H66.90

## 2021-04-11 HISTORY — DX: Unspecified perforation of tympanic membrane, unspecified ear: H72.90

## 2021-04-11 NOTE — Progress Notes (Deleted)
There were no vitals taken for this visit.   Subjective:    Patient ID: Victor King, male    DOB: 02-Jun-1981, 40 y.o.   MRN: [id number]  HPI: Victor King is a 40 y.o. male presenting on 04/11/2021 for comprehensive medical examination.   Current medical concerns include:{Blank single:19197::"none","***"}  Past Medical History:  Past Medical History:  Diagnosis Date  . Right knee injury 10/30/2012  . Right leg injury 10/30/2012    Medications:  Current Outpatient Medications on File Prior to Visit  Medication Sig  . albuterol (VENTOLIN HFA) 108 (90 Base) MCG/ACT inhaler Inhale 1-2 puffs into the lungs every 4 (four) hours as needed for wheezing.  . fluticasone (FLONASE) 50 MCG/ACT nasal spray Place 2 sprays into both nostrils daily.  . montelukast (SINGULAIR) 10 MG tablet Take 1 tablet (10 mg total) by mouth at bedtime.   No current facility-administered medications on file prior to visit.    He currently lives with: Interim Problems from his last visit: {Blank single:19197::"yes","no"}  He reports regular vision exams q1-5y: {Blank single:19197::"yes","no"} He reports regular dental exams q 29m: {Blank single:19197::"yes","no"} His diet consists of: {Blank single:19197::"***"} He endorses exercise and/or activity of: {Blank single:19197::"***"} He works at: [employer] single:19197::"***"}  He {Blank single:19197::"denies","endorses"} ETOH use: {Blank single:19197::"***"} He {Blank single:19197::"denies","endorses"} nictoine use: {Blank single:19197::"***"} He {Blank single:19197::"denies","endorses"} illegal substance use: {Blank single:19197::"***"}  He is {sexual partners:315163}  He {Blank single:19197::"denies","endorses"} concerns today about STI  He {Blank single:19197::"endorses","denies"} concerns about skin changes today: {SKIN/BREAST ROS:22996} He {Blank single:19197::"endorses","denies"} concerns about bowel changes today: {SYMPTOMS; WERXV:40086} He {Blank  single:19197::"endorses","denies"} concerns about bladder changes today: {Symptoms; bladder:10412}  Depression Screen done today and results listed below:  Depression screen [facility] 2/9 03/14/2021 03/14/2021  Decreased Interest 0 0  Down, Depressed, Hopeless 0 0  PHQ - 2 Score 0 0  Altered sleeping 0 -  Tired, decreased energy 0 -  Change in appetite 0 -  Feeling bad or failure about yourself  0 -  Trouble concentrating 0 -  Moving slowly or fidgety/restless 0 -  Suicidal thoughts 0 -  PHQ-9 Score 0 -   Anxiety Screen {Desc; done/not:10129} GAD 7 : Generalized Anxiety Score 03/14/2021  Nervous, Anxious, on Edge 0  Control/stop worrying 0  Worry too much - different things 0  Trouble relaxing 0  Restless 0  Easily annoyed or irritable 0  Afraid - awful might happen 0  Total GAD 7 Score 0    The patient {has/does not have:19849} a history of falls. I {did/did not:19850} complete a risk assessment for falls. A plan of care for falls {was/was not:19852} documented. Fall Risk  03/14/2021  Falls in the past year? 0  Number falls in past yr: 0  Risk for fall due to : No Fall Risks     Surgical History:  Past Surgical History:  Procedure Laterality Date  . BUNIONECTOMY    . HERNIA REPAIR      Allergies:  No Known Allergies  Social History:  Social History   Socioeconomic History  . Marital status: Single    Spouse name: Not on file  . Number of children: Not on file  . Years of education: Not on file  . Highest education level: Not on file  Occupational History  . Not on file  Tobacco Use  . Smoking status: Never Smoker  . Smokeless tobacco: Never Used  Substance and Sexual Activity  . Alcohol use: No  . Drug use: No  .  Sexual activity: Not on file  Other Topics Concern  . Not on file  Social History Narrative  . Not on file   Social Determinants of Health   Financial Resource Strain: Not on file  Food Insecurity: Not on file  Transportation Needs: Not on file   Physical Activity: Not on file  Stress: Not on file  Social Connections: Not on file  Intimate Partner Violence: Not on file   Social History   Tobacco Use  Smoking Status Never Smoker  Smokeless Tobacco Never Used   Social History   Substance and Sexual Activity  Alcohol Use No    Family History:  Family History  Problem Relation Age of Onset  . Sudden death Neg Hx   . Hypertension Neg Hx   . Hyperlipidemia Neg Hx   . Heart attack Neg Hx   . Diabetes Neg Hx     Past medical history, surgical history, medications, allergies, family history and social history reviewed with patient today and changes made to appropriate areas of the chart.   All ROS negative except what is listed above and in the HPI.      Objective:    There were no vitals taken for this visit.  Wt Readings from Last 3 Encounters:  03/14/21 136 lb 14.5 oz (62.1 kg)  03/14/21 137 lb (62.1 kg)  12/18/12 155 lb (70.3 kg)    Physical Exam  Results for orders placed or performed during the hospital encounter of 03/14/21  Lipid panel  Result Value Ref Range   Cholesterol 209 (H) 0 - 200 mg/dL   Triglycerides 84 <800 mg/dL   HDL 88 >34 mg/dL   Total CHOL/HDL Ratio 2.4 RATIO   VLDL 17 0 - 40 mg/dL   LDL Cholesterol 917 (H) 0 - 99 mg/dL  Comprehensive metabolic panel  Result Value Ref Range   Sodium 137 135 - 145 mmol/L   Potassium 4.0 3.5 - 5.1 mmol/L   Chloride 101 98 - 111 mmol/L   CO2 33 (H) 22 - 32 mmol/L   Glucose, Bld 102 (H) 70 - 99 mg/dL   BUN 19 6 - 20 mg/dL   Creatinine, Ser 9.15 0.61 - 1.24 mg/dL   Calcium 9.8 8.9 - 05.6 mg/dL   Total Protein 7.5 6.5 - 8.1 g/dL   Albumin 4.7 3.5 - 5.0 g/dL   AST 21 15 - 41 U/L   ALT 17 0 - 44 U/L   Alkaline Phosphatase 55 38 - 126 U/L   Total Bilirubin 0.6 0.3 - 1.2 mg/dL   GFR, Estimated >97 >94 mL/min   Anion gap 3 (L) 5 - 15  CBC with Differential  Result Value Ref Range   WBC 3.4 (L) 4.0 - 10.5 K/uL   RBC 4.71 4.22 - 5.81 MIL/uL    Hemoglobin 14.4 13.0 - 17.0 g/dL   HCT 80.1 65.5 - 37.4 %   MCV 90.7 80.0 - 100.0 fL   MCH 30.6 26.0 - 34.0 pg   MCHC 33.7 30.0 - 36.0 g/dL   RDW 82.7 07.8 - 67.5 %   Platelets 228 150 - 400 K/uL   nRBC 0.0 0.0 - 0.2 %   Neutrophils Relative % 50 %   Neutro Abs 1.7 1.7 - 7.7 K/uL   Lymphocytes Relative 39 %   Lymphs Abs 1.3 0.7 - 4.0 K/uL   Monocytes Relative 8 %   Monocytes Absolute 0.3 0.1 - 1.0 K/uL   Eosinophils Relative 2 %   Eosinophils  Absolute 0.1 0.0 - 0.5 K/uL   Basophils Relative 1 %   Basophils Absolute 0.0 0.0 - 0.1 K/uL   Immature Granulocytes 0 %   Abs Immature Granulocytes 0.01 0.00 - 0.07 K/uL      Assessment & Plan:   Problem List Items Addressed This Visit   None      Discussed aspirin prophylaxis for myocardial infarction prevention and decision was {Blank single:19197::"it was not indicated","made to continue ASA","made to start ASA","made to stop ASA","that we recommended ASA, and patient refused"}  LABORATORY TESTING:  Health maintenance labs ordered today as discussed above.  - STI testing: {Blank single:19197::"deferred","done today","not applicable","up to date","done elsewhere"}  The natural history of prostate cancer and ongoing controversy regarding screening and potential treatment outcomes of prostate cancer has been discussed with the patient. The meaning of a false positive PSA and a false negative PSA has been discussed. He indicates understanding of the limitations of this screening test and wishes *** to proceed with screening PSA testing.  IMMUNIZATIONS:   - Tdap: Tetanus vaccination status reviewed: {tetanus status:315746}. - Influenza: {Blank single:19197::"Up to date","Administered today","Postponed to flu season","Refused","Given elsewhere"} - Pneumovax: {Blank single:19197::"Up to date","Administered today","Not applicable","Refused","Given elsewhere"} - Prevnar: {Blank single:19197::"Up to date","Administered today","Not  applicable","Refused","Given elsewhere"} - HPV: {Blank single:19197::"Up to date","Administered today","Not applicable","Refused","Given elsewhere"} - Zostavax vaccine: {Blank single:19197::"Up to date","Administered today","Not applicable","Refused","Given elsewhere"}  SCREENING: - Colonoscopy: {Blank single:19197::"Up to date","Ordered today","Not applicable","Refused","Done elsewhere"}  Discussed with patient purpose of the colonoscopy is to detect colon cancer at curable precancerous or Carlon Chaloux stages   - AAA Screening: {Blank single:19197::"Up to date","Ordered today","Not applicable","Refused","Done elsewhere"}  -Hearing Test: {Blank single:19197::"Up to date","Ordered today","Not applicable","Refused","Done elsewhere"}  -Spirometry: {Blank single:19197::"Up to date","Ordered today","Not applicable","Refused","Done elsewhere"}   PATIENT COUNSELING:   For all adult patients, I recommend A well balanced diet low in saturated fats, cholesterol, and moderation in carbohydrates.   This can be as simple as monitoring portion sizes and cutting back on sugary beverages such as soda and  juice to start with.    Daily water consumption of at least 64 ounces.  Physical activity at least 180 minutes per week, if just starting out.   This can be as simple as taking the stairs instead of the elevator and walking 2-3 laps around the office  purposefully every day.   STD protection, partner selection, and regular testing if high risk.  Limited consumption of alcoholic beverages if alcohol is consumed.  For women, I recommend no more than 7 alcoholic beverages per week, spread out throughout the week.  Avoid "binge" drinking or consuming large quantities of alcohol in one setting.   Please let me know if you feel you may need help with reduction or quitting alcohol consumption.   Avoidance of nicotine, if used.  Please let me know if you feel you may need help with reduction or quitting nicotine  use.   Daily mental health attention.  This can be in the form of 5 minute daily meditation, prayer, journaling, yoga, reflection, etc.   Purposeful attention to your emotions and mental state can significantly improve your overall wellbeing  and  Health.  Please know that I am here to help you with all of your health care goals and am happy to work with you to find a solution that works best for you.  The greatest advice I have received with any changes in life are to take it one step at a time, that even means if all you can focus on  is the next 60 seconds, then do that and celebrate your victories.  With any changes in life, you will have set backs, and that is OK. The important thing to remember is, if you have a set back, it is not a failure, it is an opportunity to try again!  Health Maintenance Recommendations Screening Testing  Mammogram  Every 1 -2 years based on history and risk factors  Starting at age 41  Pap Smear  Ages 21-39 every 3 years  Ages 6930-65 every 5 years with HPV testing  More frequent testing may be required based on results and history  Colon Cancer Screening  Every 1-10 years based on test performed, risk factors, and history  Starting at age 41  Bone Density Screening  Every 2-10 years based on history  Starting at age 41 for women  Recommendations for men differ based on medication usage, history, and risk factors  AAA Screening  One time ultrasound  Men 4165-41 years old who have every smoked  Lung Cancer Screening  Low Dose Lung CT every 12 months  Age 53-80 years with a 30 pack-year smoking history who still smoke or who have quit within the last 15 years  Screening Labs  Routine  Labs: Complete Blood Count (CBC), Complete Metabolic Panel (CMP), Cholesterol (Lipid Panel)  Every 6-12 months based on history and medications  May be recommended more frequently based on current conditions or previous results  Hemoglobin A1c  Lab  Every 3-12 months based on history and previous results  Starting at age 41 or earlier with diagnosis of diabetes, high cholesterol, BMI >26, and/or risk factors  Frequent monitoring for patients with diabetes to ensure blood sugar control  Thyroid Panel (TSH w/ T3 & T4)  Every 6 months based on history, symptoms, and risk factors  May be repeated more often if on medication  HIV  One time testing for all patients 3813 and older  May be repeated more frequently for patients with increased risk factors or exposure  Hepatitis C  One time testing for all patients 4518 and older  May be repeated more frequently for patients with increased risk factors or exposure  Gonorrhea, Chlamydia  Every 12 months for all sexually active persons 13-24 years  Additional monitoring may be recommended for those who are considered high risk or who have symptoms  PSA  Men 7240-41 years old with risk factors  Additional screening may be recommended from age 41-69 based on risk factors, symptoms, and history  Vaccine Recommendations  Tetanus Booster  All adults every 10 years  Flu Vaccine  All patients 6 months and older every year  COVID Vaccine  All patients 12 years and older  Initial dosing with booster  May recommend additional booster based on age and health history  HPV Vaccine  2 doses all patients age 78-26  Dosing may be considered for patients over 26  Shingles Vaccine (Shingrix)  2 doses all adults 55 years and older  Pneumonia (Pneumovax 23)  All adults 65 years and older  May recommend earlier dosing based on health history  Pneumonia (Prevnar 6613)  All adults 65 years and older  Dosed 1 year after Pneumovax 23  Additional Screening, Testing, and Vaccinations may be recommended on an individualized basis based on family history, health history, risk factors, and/or exposure.    Follow up plan: NEXT PREVENTATIVE PHYSICAL DUE IN 1 YEAR. No  follow-ups on file.

## 2021-05-02 ENCOUNTER — Encounter (HOSPITAL_BASED_OUTPATIENT_CLINIC_OR_DEPARTMENT_OTHER): Payer: BC Managed Care – PPO | Admitting: Nurse Practitioner

## 2021-05-30 ENCOUNTER — Other Ambulatory Visit: Payer: Self-pay

## 2021-05-30 ENCOUNTER — Ambulatory Visit (INDEPENDENT_AMBULATORY_CARE_PROVIDER_SITE_OTHER): Payer: BC Managed Care – PPO | Admitting: Nurse Practitioner

## 2021-05-30 ENCOUNTER — Encounter (HOSPITAL_BASED_OUTPATIENT_CLINIC_OR_DEPARTMENT_OTHER): Payer: Self-pay | Admitting: Nurse Practitioner

## 2021-05-30 VITALS — BP 122/75 | HR 62 | Ht 67.0 in | Wt 136.2 lb

## 2021-05-30 DIAGNOSIS — J9801 Acute bronchospasm: Secondary | ICD-10-CM

## 2021-05-30 DIAGNOSIS — Z0000 Encounter for general adult medical examination without abnormal findings: Secondary | ICD-10-CM | POA: Diagnosis not present

## 2021-05-30 DIAGNOSIS — R058 Other specified cough: Secondary | ICD-10-CM

## 2021-05-30 DIAGNOSIS — M21612 Bunion of left foot: Secondary | ICD-10-CM

## 2021-05-30 DIAGNOSIS — K219 Gastro-esophageal reflux disease without esophagitis: Secondary | ICD-10-CM

## 2021-05-30 DIAGNOSIS — Z9109 Other allergy status, other than to drugs and biological substances: Secondary | ICD-10-CM

## 2021-05-30 MED ORDER — PANTOPRAZOLE SODIUM 40 MG PO TBEC: 40.0000 mg | DELAYED_RELEASE_TABLET | Freq: Every day | ORAL | 11 refills | Status: AC

## 2021-05-30 MED ORDER — FLUTICASONE PROPIONATE 50 MCG/ACT NA SUSP: 2.0000 | Freq: Every day | NASAL | 6 refills | Status: DC

## 2021-05-30 MED ORDER — ALBUTEROL SULFATE HFA 108 (90 BASE) MCG/ACT IN AERS: 1.0000 | INHALATION_SPRAY | RESPIRATORY_TRACT | 6 refills | Status: DC | PRN

## 2021-05-30 MED ORDER — MONTELUKAST SODIUM 10 MG PO TABS: 10.0000 mg | ORAL_TABLET | Freq: Every day | ORAL | 3 refills | Status: DC

## 2021-05-30 MED ORDER — ALBUTEROL SULFATE HFA 108 (90 BASE) MCG/ACT IN AERS: 1.0000 | INHALATION_SPRAY | RESPIRATORY_TRACT | 1 refills | Status: DC | PRN

## 2021-05-30 MED ORDER — MONTELUKAST SODIUM 10 MG PO TABS: 10.0000 mg | ORAL_TABLET | Freq: Every day | ORAL | 12 refills | Status: AC

## 2021-05-30 MED ORDER — FLUTICASONE PROPIONATE 50 MCG/ACT NA SUSP: 2.0000 | Freq: Every day | NASAL | 0 refills | Status: DC

## 2021-05-30 NOTE — Assessment & Plan Note (Signed)
Chronic clearing of throat with increased mucous production.  Symptoms and presentation consistent with GERD symptoms.  Allergies appear to be well controlled.  Will begin pantoprazole daily to see if this will help with symptoms.  Recommend f/u if symptoms do not improve in 4 weeks of treatment.  May need GI referral if continues

## 2021-05-30 NOTE — Assessment & Plan Note (Signed)
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  Ulceration present on dorsal surface of second toe- like from displacement of toe due to deformity and chronic friction in the shoe. Right foot appears well aligned with well healed surgical scar. Referral for podiatry placed.

## 2021-05-30 NOTE — Progress Notes (Signed)
BP 122/75   Pulse 62   Ht  (1.702 m)   Wt 136 lb 3.2 oz (61.8 kg)   SpO2 100%   BMI 21.33 kg/m    Subjective:    Patient ID: Victor King, male    DOB: 1981/01/06, 40 y.o.   MRN: [id number]  HPI: Victor King is a 40 y.o. male presenting on 05/30/2021 for comprehensive medical examination.   Current medical concerns include: bunion, cough.  Cough Reports chronic need to clear throat with tickle and "wheeze" felt in the throat at all times. Increased mucous in throat.  This has been ongoing.  Allergy symptoms have improved, but the cough has not completely resolved.  Endorses occasional epigastric pain with reflux symptoms No N/V, dark stools, or bloody emesis.   Bunion Endorses bunion on left foot with lateral turning of great toe.  Previous bunion surgery on right foot.  Reports that his feet hurt, specifically when walking and standing on his feet at work.  Would like podiatry referral  Past Medical History:  Past Medical History:  Diagnosis Date   Otitis 04/11/2021   Right knee injury 10/30/2012   Right leg injury 10/30/2012   Unspecified perforation of tympanic membrane, unspecified ear 04/11/2021    Medications:  No current outpatient medications on file prior to visit.   No current facility-administered medications on file prior to visit.    He currently lives with: his brother Interim Problems from his last visit:  yes- see above  He reports regular vision exams q1-5y: no He reports regular dental exams q 43m: no His diet consists of:  mix- eats both home made meals and fast food He endorses exercise and/or activity of:  walks a lot at work He works at:  [employer]  He denies ETOH use He denies nictoine use He denies illegal substance use  He is not sexually active  He denies concerns today about STI  He denies concerns about skin changes today:  He denies concerns about bowel changes today:  He denies concerns about bladder changes  today:   Depression Screen done today and results listed below:  Depression screen [facility] 2/9 03/14/2021 03/14/2021  Decreased Interest 0 0  Down, Depressed, Hopeless 0 0  PHQ - 2 Score 0 0  Altered sleeping 0 -  Tired, decreased energy 0 -  Change in appetite 0 -  Feeling bad or failure about yourself  0 -  Trouble concentrating 0 -  Moving slowly or fidgety/restless 0 -  Suicidal thoughts 0 -  PHQ-9 Score 0 -   Anxiety Screen not done GAD 7 : Generalized Anxiety Score 03/14/2021  Nervous, Anxious, on Edge 0  Control/stop worrying 0  Worry too much - different things 0  Trouble relaxing 0  Restless 0  Easily annoyed or irritable 0  Afraid - awful might happen 0  Total GAD 7 Score 0    The patient does not have a history of falls. I did not complete a risk assessment for falls. A plan of care for falls was not documented. Fall Risk  03/14/2021  Falls in the past year? 0  Number falls in past yr: 0  Risk for fall due to : No Fall Risks     Surgical History:  Past Surgical History:  Procedure Laterality Date   BUNIONECTOMY     HERNIA REPAIR      Allergies:  No Known Allergies  Social History:  Social History   Socioeconomic  History   Marital status: Single    Spouse name: Not on file   Number of children: Not on file   Years of education: Not on file   Highest education level: Not on file  Occupational History   Not on file  Tobacco Use   Smoking status: Never   Smokeless tobacco: Never  Substance and Sexual Activity   Alcohol use: No   Drug use: No   Sexual activity: Not on file  Other Topics Concern   Not on file  Social History Narrative   Not on file   Social Determinants of Health   Financial Resource Strain: Not on file  Food Insecurity: Not on file  Transportation Needs: Not on file  Physical Activity: Not on file  Stress: Not on file  Social Connections: Not on file  Intimate Partner Violence: Not on file   Social History   Tobacco Use   Smoking Status Never  Smokeless Tobacco Never   Social History   Substance and Sexual Activity  Alcohol Use No    Family History:  Family History  Problem Relation Age of Onset   Sudden death Neg Hx    Hypertension Neg Hx    Hyperlipidemia Neg Hx    Heart attack Neg Hx    Diabetes Neg Hx     Past medical history, surgical history, medications, allergies, family history and social history reviewed with patient today and changes made to appropriate areas of the chart.   All ROS negative except what is listed above and in the HPI.      Objective:    BP 122/75   Pulse 62   Ht 5\' 7"  (1.702 m)   Wt 136 lb 3.2 oz (61.8 kg)   SpO2 100%   BMI 21.33 kg/m   Wt Readings from Last 3 Encounters:  05/30/21 136 lb 3.2 oz (61.8 kg)  03/14/21 136 lb 14.5 oz (62.1 kg)  03/14/21 137 lb (62.1 kg)    Physical Exam Vitals and nursing note reviewed.  Constitutional:      Appearance: Normal appearance. He is normal weight.  HENT:     Head: Normocephalic and atraumatic.     Right Ear: Tympanic membrane, ear canal and external ear normal.     Left Ear: Tympanic membrane, ear canal and external ear normal.     Nose: Nose normal.     Mouth/Throat:     Mouth: Mucous membranes are moist.     Pharynx: Oropharyngeal exudate present.  Eyes:     Extraocular Movements: Extraocular movements intact.     Conjunctiva/sclera: Conjunctivae normal.     Pupils: Pupils are equal, round, and reactive to light.  Neck:     Vascular: No carotid bruit.  Cardiovascular:     Rate and Rhythm: Normal rate and regular rhythm.     Pulses: Normal pulses.          Dorsalis pedis pulses are 2+ on the right side and 2+ on the left side.       Posterior tibial pulses are 2+ on the right side and 2+ on the left side.     Heart sounds: Normal heart sounds.  Pulmonary:     Effort: Pulmonary effort is normal.     Breath sounds: Normal breath sounds.     Comments: Constant clearing of throat with mucous present on  the posterior pharynx.  Abdominal:     General: Abdomen is flat. Bowel sounds are normal. There is no distension.  Palpations: Abdomen is soft. There is no mass.     Tenderness: There is no abdominal tenderness. There is no right CVA tenderness, left CVA tenderness, guarding or rebound.  Musculoskeletal:        General: Deformity present.     Cervical back: Normal range of motion and neck supple. No tenderness.     Right lower leg: No edema.     Left lower leg: No edema.     Left foot: Bunion present.       Feet:  Feet:     Right foot:     Skin integrity: Skin integrity normal.     Left foot:     Skin integrity: Ulcer present.  Lymphadenopathy:     Cervical: No cervical adenopathy.  Skin:    General: Skin is warm and dry.     Capillary Refill: Capillary refill takes less than 2 seconds.  Neurological:     General: No focal deficit present.     Mental Status: He is alert and oriented to person, place, and time.  Psychiatric:        Mood and Affect: Mood normal.        Behavior: Behavior normal.        Thought Content: Thought content normal.        Judgment: Judgment normal.    Results for orders placed or performed during the hospital encounter of 03/14/21  Lipid panel  Result Value Ref Range   Cholesterol 209 (H) 0 - 200 mg/dL   Triglycerides 84 <295 mg/dL   HDL 88 >28 mg/dL   Total CHOL/HDL Ratio 2.4 RATIO   VLDL 17 0 - 40 mg/dL   LDL Cholesterol 413 (H) 0 - 99 mg/dL  Comprehensive metabolic panel  Result Value Ref Range   Sodium 137 135 - 145 mmol/L   Potassium 4.0 3.5 - 5.1 mmol/L   Chloride 101 98 - 111 mmol/L   CO2 33 (H) 22 - 32 mmol/L   Glucose, Bld 102 (H) 70 - 99 mg/dL   BUN 19 6 - 20 mg/dL   Creatinine, Ser 2.44 0.61 - 1.24 mg/dL   Calcium 9.8 8.9 - 01.0 mg/dL   Total Protein 7.5 6.5 - 8.1 g/dL   Albumin 4.7 3.5 - 5.0 g/dL   AST 21 15 - 41 U/L   ALT 17 0 - 44 U/L   Alkaline Phosphatase 55 38 - 126 U/L   Total Bilirubin 0.6 0.3 - 1.2 mg/dL   GFR,  Estimated >27 >25 mL/min   Anion gap 3 (L) 5 - 15  CBC with Differential  Result Value Ref Range   WBC 3.4 (L) 4.0 - 10.5 K/uL   RBC 4.71 4.22 - 5.81 MIL/uL   Hemoglobin 14.4 13.0 - 17.0 g/dL   HCT 36.6 44.0 - 34.7 %   MCV 90.7 80.0 - 100.0 fL   MCH 30.6 26.0 - 34.0 pg   MCHC 33.7 30.0 - 36.0 g/dL   RDW 42.5 95.6 - 38.7 %   Platelets 228 150 - 400 K/uL   nRBC 0.0 0.0 - 0.2 %   Neutrophils Relative % 50 %   Neutro Abs 1.7 1.7 - 7.7 K/uL   Lymphocytes Relative 39 %   Lymphs Abs 1.3 0.7 - 4.0 K/uL   Monocytes Relative 8 %   Monocytes Absolute 0.3 0.1 - 1.0 K/uL   Eosinophils Relative 2 %   Eosinophils Absolute 0.1 0.0 - 0.5 K/uL   Basophils Relative 1 %  Basophils Absolute 0.0 0.0 - 0.1 K/uL   Immature Granulocytes 0 %   Abs Immature Granulocytes 0.01 0.00 - 0.07 K/uL      Assessment & Plan:   Problem List Items Addressed This Visit     Allergic cough    Cough improved, however, still experiencing chronic need to clear throat.  Sx improved with medication- continue allergy medication and inhaler at this time.  Add pantoprazole for suspected reflux symptoms contributing to chronic cough.  F/U if sx worsen or fail to improve with tx       Relevant Medications   fluticasone (FLONASE) 50 MCG/ACT nasal spray   montelukast (SINGULAIR) 10 MG tablet   Bronchospasm    Lungs clear today with no evidence of shortness of breath or wheezing.  Continue montelukast, albuterol, and fluticasone.  Mold presence still likely in the home as repairs have not been completed.  May consider tapering off of medication once this is completed.  If symptoms worsen or persist- referral for PFTs       Relevant Medications   montelukast (SINGULAIR) 10 MG tablet   albuterol (VENTOLIN HFA) 108 (90 Base) MCG/ACT inhaler   Allergy to environmental factors    Allergy symptoms appear much improved with current medications.  Chronic clearing of throat still present- this is likely GERD  related. Environmental allergens likely still present given that repairs of the home have not been completed.  Continue medications- may consider tapering off once repairs complete, however, will need to monitor closely.  F/U if sx worsen       Encounter for annual physical exam - Primary    CPE today. Recommend dental visits every 6 months Recommend eye exam at least every 5 years.  Recommend flu vaccine in the fall.  Will begin colon cancer screening at 44. Labs reviewed with patient.  Recommend increased physical activity.  Recommend limiting fast foods and fried foods in diet. F/U in 12 months.        Gastroesophageal reflux disease    Chronic clearing of throat with increased mucous production.  Symptoms and presentation consistent with GERD symptoms.  Allergies appear to be well controlled.  Will begin pantoprazole daily to see if this will help with symptoms.  Recommend f/u if symptoms do not improve in 4 weeks of treatment.  May need GI referral if continues       Relevant Medications   pantoprazole (PROTONIX) 40 MG tablet   Bunion of great toe of left foot    Bunion with deformity of great toe of left foot.  Ulceration present on dorsal surface of second toe- like from displacement of toe due to deformity and chronic friction in the shoe. Right foot appears well aligned with well healed surgical scar. Referral for podiatry placed.         Relevant Orders   Ambulatory referral to Podiatry     Discussed aspirin prophylaxis for myocardial infarction prevention and decision was it was not indicated  LABORATORY TESTING:  Health maintenance labs ordered today as discussed above.  - STI testing: deferred  IMMUNIZATIONS:   - Tdap: Tetanus vaccination status reviewed: reports this is up to date- records requested. - Influenza: Postponed to flu season - Pneumovax: Not applicable - Prevnar: Not applicable - HPV: Not applicable - Zostavax vaccine: Not  applicable  SCREENING: - Colonoscopy: Not applicable  Discussed with patient purpose of the colonoscopy is to detect colon cancer at curable precancerous or Peyten Punches stages   - AAA Screening:  Not applicable  -Hearing Test: Not applicable  -Spirometry: Not applicable   PATIENT COUNSELING:   For all adult patients, I recommend A well balanced diet low in saturated fats, cholesterol, and moderation in carbohydrates.   This can be as simple as monitoring portion sizes and cutting back on sugary beverages such as soda and  juice to start with.    Daily water consumption of at least 64 ounces.  Physical activity at least 180 minutes per week, if just starting out.   This can be as simple as taking the stairs instead of the elevator and walking 2-3 laps around the office  purposefully every day.   STD protection, partner selection, and regular testing if high risk.  Limited consumption of alcoholic beverages if alcohol is consumed.  For women, I recommend no more than 7 alcoholic beverages per week, spread out throughout the week.  Avoid "binge" drinking or consuming large quantities of alcohol in one setting.   Please let me know if you feel you may need help with reduction or quitting alcohol consumption.   Avoidance of nicotine, if used.  Please let me know if you feel you may need help with reduction or quitting nicotine use.   Daily mental health attention.  This can be in the form of 5 minute daily meditation, prayer, journaling, yoga, reflection, etc.   Purposeful attention to your emotions and mental state can significantly improve your overall wellbeing  and  Health.  Please know that I am here to help you with all of your health care goals and am happy to work with you to find a solution that works best for you.  The greatest advice I have received with any changes in life are to take it one step at a time, that even means if all you can focus on is the next 60 seconds, then do  that and celebrate your victories.  With any changes in life, you will have set backs, and that is OK. The important thing to remember is, if you have a set back, it is not a failure, it is an opportunity to try again!  Health Maintenance Recommendations Screening Testing Mammogram Every 1 -2 years based on history and risk factors Starting at age 41 Pap Smear Ages 21-39 every 3 years Ages 4630-65 every 5 years with HPV testing More frequent testing may be required based on results and history Colon Cancer Screening Every 1-10 years based on test performed, risk factors, and history Starting at age 41 Bone Density Screening Every 2-10 years based on history Starting at age 41 for women Recommendations for men differ based on medication usage, history, and risk factors AAA Screening One time ultrasound Men 2465-41 years old who have every smoked Lung Cancer Screening Low Dose Lung CT every 12 months Age 52-80 years with a 30 pack-year smoking history who still smoke or who have quit within the last 15 years  Screening Labs Routine  Labs: Complete Blood Count (CBC), Complete Metabolic Panel (CMP), Cholesterol (Lipid Panel) Every 6-12 months based on history and medications May be recommended more frequently based on current conditions or previous results Hemoglobin A1c Lab Every 3-12 months based on history and previous results Starting at age 945 or earlier with diagnosis of diabetes, high cholesterol, BMI >26, and/or risk factors Frequent monitoring for patients with diabetes to ensure blood sugar control Thyroid Panel (TSH w/ T3 & T4) Every 6 months based on history, symptoms, and risk factors May be repeated  more often if on medication HIV One time testing for all patients 13 and older May be repeated more frequently for patients with increased risk factors or exposure Hepatitis C One time testing for all patients 71 and older May be repeated more frequently for patients with  increased risk factors or exposure Gonorrhea, Chlamydia Every 12 months for all sexually active persons 13-24 years Additional monitoring may be recommended for those who are considered high risk or who have symptoms PSA Men 56-84 years old with risk factors Additional screening may be recommended from age 36-69 based on risk factors, symptoms, and history  Vaccine Recommendations Tetanus Booster All adults every 10 years Flu Vaccine All patients 6 months and older every year COVID Vaccine All patients 12 years and older Initial dosing with booster May recommend additional booster based on age and health history HPV Vaccine 2 doses all patients age 79-26 Dosing may be considered for patients over 26 Shingles Vaccine (Shingrix) 2 doses all adults 55 years and older Pneumonia (Pneumovax 23) All adults 65 years and older May recommend earlier dosing based on health history Pneumonia (Prevnar 57) All adults 65 years and older Dosed 1 year after Pneumovax 23  Additional Screening, Testing, and Vaccinations may be recommended on an individualized basis based on family history, health history, risk factors, and/or exposure.    Follow up plan: NEXT PREVENTATIVE PHYSICAL DUE IN 1 YEAR. Return in about 1 year (around 05/30/2022) for CPE with labs.

## 2021-05-30 NOTE — Assessment & Plan Note (Signed)
Allergy symptoms appear much improved with current medications.  Chronic clearing of throat still present- this is likely GERD related. Environmental allergens likely still present given that repairs of the home have not been completed.  Continue medications- may consider tapering off once repairs complete, however, will need to monitor closely.  F/U if sx worsen

## 2021-05-30 NOTE — Patient Instructions (Addendum)
Things look great today.   I have sent in refills for your medication and sent a new medicine called PANTOPRAZOLE. This medicine is for your chronic cough. It appears that you have reflux that is irritating the throat and esophagus causing the cough. This medication needs to be taken every day to work best and may take up to 2 weeks to fully work and help with the cough. If this is not any better in a month, let me know.   Continue your allergy medications, this really seems to be helping with your allergy symptoms.   I recommend a vision screening to check your eyes since you are over 41 years old.  I also recommend dental exams every 6 months for teeth cleaning and check.   Your labs looked good from your last visit. I would like you to work on walking 20 minutes every day and decrease fried foods and fatty foods to keep your cholesterol down.   I have sent the referral for the foot doctor to take a look at your bunion. They will call you to schedule. If you have not heard from them in 1 week, please let us know so we can check on this.   We will plan to see you back in 1 year or sooner if needed!  Enjoy the rest of your summer!  SaraBeth

## 2021-05-30 NOTE — Assessment & Plan Note (Signed)
CPE today. Recommend dental visits every 6 months Recommend eye exam at least every 5 years.  Recommend flu vaccine in the fall.  Will begin colon cancer screening at 44. Labs reviewed with patient.  Recommend increased physical activity.  Recommend limiting fast foods and fried foods in diet. F/U in 12 months.

## 2021-05-30 NOTE — Assessment & Plan Note (Signed)
Lungs clear today with no evidence of shortness of breath or wheezing.  Continue montelukast, albuterol, and fluticasone.  Mold presence still likely in the home as repairs have not been completed.  May consider tapering off of medication once this is completed.  If symptoms worsen or persist- referral for PFTs

## 2021-05-30 NOTE — Assessment & Plan Note (Signed)
Cough improved, however, still experiencing chronic need to clear throat.  Sx improved with medication- continue allergy medication and inhaler at this time.  Add pantoprazole for suspected reflux symptoms contributing to chronic cough.  F/U if sx worsen or fail to improve with tx

## 2021-08-21 ENCOUNTER — Encounter (HOSPITAL_COMMUNITY): Payer: Self-pay

## 2021-08-21 ENCOUNTER — Other Ambulatory Visit: Payer: Self-pay

## 2021-08-21 ENCOUNTER — Emergency Department (HOSPITAL_COMMUNITY)
Admission: EM | Admit: 2021-08-21 | Discharge: 2021-08-22 | Disposition: A | Discharge status: Home / Self Care | Payer: BC Managed Care – PPO | Attending: Emergency Medicine | Admitting: Emergency Medicine

## 2021-08-21 DIAGNOSIS — Y9289 Other specified places as the place of occurrence of the external cause: Secondary | ICD-10-CM | POA: Insufficient documentation

## 2021-08-21 DIAGNOSIS — Y9389 Activity, other specified: Secondary | ICD-10-CM | POA: Insufficient documentation

## 2021-08-21 DIAGNOSIS — Y990 Civilian activity done for income or pay: Secondary | ICD-10-CM | POA: Diagnosis not present

## 2021-08-21 DIAGNOSIS — R079 Chest pain, unspecified: Secondary | ICD-10-CM | POA: Diagnosis not present

## 2021-08-21 DIAGNOSIS — I10 Essential (primary) hypertension: Secondary | ICD-10-CM | POA: Diagnosis not present

## 2021-08-21 DIAGNOSIS — R55 Syncope and collapse: Secondary | ICD-10-CM | POA: Diagnosis not present

## 2021-08-21 DIAGNOSIS — S29012A Strain of muscle and tendon of back wall of thorax, initial encounter: Secondary | ICD-10-CM | POA: Diagnosis not present

## 2021-08-21 DIAGNOSIS — X500XXA Overexertion from strenuous movement or load, initial encounter: Secondary | ICD-10-CM | POA: Insufficient documentation

## 2021-08-21 DIAGNOSIS — M546 Pain in thoracic spine: Secondary | ICD-10-CM | POA: Diagnosis not present

## 2021-08-21 DIAGNOSIS — M549 Dorsalgia, unspecified: Secondary | ICD-10-CM | POA: Diagnosis not present

## 2021-08-21 DIAGNOSIS — R0789 Other chest pain: Secondary | ICD-10-CM | POA: Diagnosis not present

## 2021-08-21 DIAGNOSIS — S299XXA Unspecified injury of thorax, initial encounter: Secondary | ICD-10-CM | POA: Diagnosis not present

## 2021-08-21 LAB — CBG MONITORING, ED: Glucose-Capillary: 87 mg/dL (ref 70–99)

## 2021-08-21 NOTE — ED Notes (Signed)
Pt's urine still needs to be collected

## 2021-08-21 NOTE — ED Triage Notes (Signed)
Per EMS- Patient was lifting a 60 lb tool back and felt a sharp pain in his left flank that radiates into the left mid back. Patient reports that he passed out from the pain. [id number].

## 2021-08-22 ENCOUNTER — Emergency Department (HOSPITAL_COMMUNITY): Payer: BC Managed Care – PPO

## 2021-08-22 DIAGNOSIS — R079 Chest pain, unspecified: Secondary | ICD-10-CM | POA: Diagnosis not present

## 2021-08-22 DIAGNOSIS — M546 Pain in thoracic spine: Secondary | ICD-10-CM | POA: Diagnosis not present

## 2021-08-22 DIAGNOSIS — R0789 Other chest pain: Secondary | ICD-10-CM | POA: Diagnosis not present

## 2021-08-22 LAB — CBC WITH DIFFERENTIAL/PLATELET
Abs Immature Granulocytes: 0.01 10*3/uL (ref 0.00–0.07)
Basophils Absolute: 0 10*3/uL (ref 0.0–0.1)
Basophils Relative: 1 %
Eosinophils Absolute: 0.1 10*3/uL (ref 0.0–0.5)
Eosinophils Relative: 1 %
HCT: 42.3 % (ref 39.0–52.0)
Hemoglobin: 14.5 g/dL (ref 13.0–17.0)
Immature Granulocytes: 0 %
Lymphocytes Relative: 31 %
Lymphs Abs: 1.8 10*3/uL (ref 0.7–4.0)
MCH: 30.9 pg (ref 26.0–34.0)
MCHC: 34.3 g/dL (ref 30.0–36.0)
MCV: 90 fL (ref 80.0–100.0)
Monocytes Absolute: 0.4 10*3/uL (ref 0.1–1.0)
Monocytes Relative: 8 %
Neutro Abs: 3.4 10*3/uL (ref 1.7–7.7)
Neutrophils Relative %: 59 %
Platelets: 210 10*3/uL (ref 150–400)
RBC: 4.7 MIL/uL (ref 4.22–5.81)
RDW: 12.7 % (ref 11.5–15.5)
WBC: 5.8 10*3/uL (ref 4.0–10.5)
nRBC: 0 % (ref 0.0–0.2)

## 2021-08-22 LAB — TROPONIN I (HIGH SENSITIVITY): Troponin I (High Sensitivity): 3 ng/L (ref <18)

## 2021-08-22 LAB — BASIC METABOLIC PANEL
Anion gap: 9 (ref 5–15)
BUN: 22 mg/dL — ABNORMAL HIGH (ref 6–20)
CO2: 27 mmol/L (ref 22–32)
Calcium: 8.7 mg/dL — ABNORMAL LOW (ref 8.9–10.3)
Chloride: 100 mmol/L (ref 98–111)
Creatinine, Ser: 0.85 mg/dL (ref 0.61–1.24)
GFR, Estimated: >60 mL/min (ref >60)
Glucose, Bld: 93 mg/dL (ref 70–99)
Potassium: 3.6 mmol/L (ref 3.5–5.1)
Sodium: 136 mmol/L (ref 135–145)

## 2021-08-22 LAB — D-DIMER, QUANTITATIVE: D-Dimer, Quant: 0.44 ug/mL-FEU (ref 0.00–0.50)

## 2021-08-22 MED ORDER — SODIUM CHLORIDE 0.9 % IV BOLUS
1000.0000 mL | Freq: Once | INTRAVENOUS | Status: AC
  Administered 2021-08-22: 1000 mL via INTRAVENOUS

## 2021-08-22 MED ORDER — KETOROLAC TROMETHAMINE 15 MG/ML IJ SOLN
15.0000 mg | Freq: Once | INTRAMUSCULAR | Status: AC
  Administered 2021-08-22: 15 mg via INTRAVENOUS
  Filled 2021-08-22: qty 1

## 2021-08-22 NOTE — ED Provider Notes (Signed)
[facility]-EMERGENCY DEPT Provider Note   CSN: [id number] Arrival date & time: 08/21/21  1743     History Chief Complaint  Patient presents with   Back Pain   Loss of Consciousness    Victor King is a 40 y.o. male.  HPI 40 year old male presents with a chief complaint of left back pain as well as some chest pain.  On 10/1 he thinks he might of tweaked his left back when he was lifting up a billiards table at work.  Then yesterday afternoon he developed some left-sided back pain when he bent down to pick up a toolbox.  This caused him to fall to the ground because of the pain.  He then had another episode where he had to lower himself to the ground because he developed some chest pain around 2 PM.  Felt like a squeezing but now is more dull.  It is improving but not gone.  It is in the same area that his back is hurting in his ribs.  No shortness of breath.  He felt like his left side of his body went numb though this is gone.  Otherwise he has no numbness or weakness in his extremities and no incontinence.  No direct trauma to his back.  He does have some chronic back issues.  He took some Tylenol for the pain.  No recent illness.  Triage note indicates syncope but the patient states he never passed out, just had to lower himself to the ground.  Past Medical History:  Diagnosis Date   Otitis 04/11/2021   Right knee injury 10/30/2012   Right leg injury 10/30/2012   Unspecified perforation of tympanic membrane, unspecified ear 04/11/2021    Patient Active Problem List   Diagnosis Date Noted   Gastroesophageal reflux disease 05/30/2021   Bunion of great toe of left foot 05/30/2021   Encounter for annual physical exam 04/11/2021   Encounter to establish care 03/14/2021   Allergic cough 03/14/2021   Bronchospasm 03/14/2021   Allergy to environmental factors 03/14/2021    Past Surgical History:  Procedure Laterality Date   BUNIONECTOMY     HERNIA REPAIR          Family History  Problem Relation Age of Onset   Heart attack Mother    Cancer Father    Sudden death Neg Hx    Hypertension Neg Hx    Hyperlipidemia Neg Hx    Diabetes Neg Hx     Social History   Tobacco Use   Smoking status: Never   Smokeless tobacco: Never  Vaping Use   Vaping Use: Never used  Substance Use Topics   Alcohol use: No   Drug use: No    Home Medications Prior to Admission medications   Medication Sig Start Date End Date Taking? Authorizing Provider  albuterol (VENTOLIN HFA) 108 (90 Base) MCG/ACT inhaler Inhale 1-2 puffs into the lungs every 4 (four) hours as needed for wheezing. 05/30/21   Early, Sung Amabile, NP  fluticasone (FLONASE) 50 MCG/ACT nasal spray Place 2 sprays into both nostrils daily. 05/30/21   Early, Sung Amabile, NP  montelukast (SINGULAIR) 10 MG tablet Take 1 tablet (10 mg total) by mouth at bedtime. 05/30/21   Tollie Eth, NP  pantoprazole (PROTONIX) 40 MG tablet Take 1 tablet (40 mg total) by mouth daily. 05/30/21   Tollie Eth, NP    Allergies    Patient has no known allergies.  Review of Systems  Review of Systems  Constitutional:  Negative for fever.  Respiratory:  Negative for shortness of breath.   Cardiovascular:  Positive for chest pain.  Gastrointestinal:  Negative for abdominal pain.  Musculoskeletal:  Positive for back pain.  Neurological:  Negative for syncope, weakness and headaches.  All other systems reviewed and are negative.  Physical Exam Updated Vital Signs BP 128/88   Pulse 68   Temp 97.7 F (36.5 C) (Oral)   Resp 20   Ht 5\' 5"  (1.651 m)   Wt 54.4 kg   SpO2 98%   BMI 19.97 kg/m   Physical Exam Vitals and nursing note reviewed.  Constitutional:      General: He is not in acute distress.    Appearance: He is well-developed. He is not ill-appearing or diaphoretic.  HENT:     Head: Normocephalic and atraumatic.     Right Ear: External ear normal.     Left Ear: External ear normal.     Nose: Nose  normal.  Eyes:     General:        Right eye: No discharge.        Left eye: No discharge.     Extraocular Movements: Extraocular movements intact.     Pupils: Pupils are equal, round, and reactive to light.  Cardiovascular:     Rate and Rhythm: Normal rate and regular rhythm.     Heart sounds: Normal heart sounds.  Pulmonary:     Effort: Pulmonary effort is normal.     Breath sounds: Normal breath sounds.  Abdominal:     Palpations: Abdomen is soft.     Tenderness: There is no abdominal tenderness.  Musculoskeletal:     Cervical back: Neck supple.     Thoracic back: No bony tenderness.     Lumbar back: No tenderness or bony tenderness.       Back:  Skin:    General: Skin is warm and dry.  Neurological:     Mental Status: He is alert.     Comments: CN 3-12 grossly intact. 5/5 strength in all 4 extremities. Grossly normal sensation. Normal finger to nose. Able to ambulate, though with some pain in back  Psychiatric:        Mood and Affect: Mood is not anxious.    ED Results / Procedures / Treatments   Labs (all labs ordered are listed, but only abnormal results are displayed) Labs Reviewed  BASIC METABOLIC PANEL - Abnormal; Notable for the following components:      Result Value   BUN 22 (*)    Calcium 8.7 (*)    All other components within normal limits  CBC WITH DIFFERENTIAL/PLATELET  D-DIMER, QUANTITATIVE  URINALYSIS, ROUTINE W REFLEX MICROSCOPIC  CBG MONITORING, ED  TROPONIN I (HIGH SENSITIVITY)  TROPONIN I (HIGH SENSITIVITY)    EKG EKG Interpretation  Date/Time:  Sunday August 21 2021 18:00:43 EDT Ventricular Rate:  63 PR Interval:  146 QRS Duration: 96 QT Interval:  398 QTC Calculation: 407 R Axis:   78 Text Interpretation: Normal sinus rhythm Minimal voltage criteria for LVH, may be normal variant ( Sokolow-Lyon ) Nonspecific ST and T wave abnormality  overall similar to April 2022 Confirmed by May 2022 [phone]) on 08/22/2021 12:13:03  AM  Radiology DG Chest 2 View  Result Date: 08/22/2021 CLINICAL DATA:  Left chest pain. EXAM: CHEST - 2 VIEW COMPARISON:  March 14, 2021 FINDINGS: The heart size and mediastinal contours are within normal limits. Both lungs are  clear. Mild levoscoliosis of the mid to lower thoracic spine is seen. IMPRESSION: No active cardiopulmonary disease. Electronically Signed   By: Aram Candela M.D.   On: 08/22/2021 01:39    Procedures Procedures   Medications Ordered in ED Medications  sodium chloride 0.9 % bolus 1,000 mL (1,000 mLs Intravenous New Bag/Given 08/22/21 0038)  ketorolac (TORADOL) 15 MG/ML injection 15 mg (15 mg Intravenous Given 08/22/21 0045)    ED Course  I have reviewed the triage vital signs and the nursing notes.  Pertinent labs & imaging results that were available during my care of the patient were reviewed by me and considered in my medical decision making (see chart for details).    MDM Rules/Calculators/A&P                           Presentation is most consistent with a thoracic back strain.  Given stable vital signs, my suspicion of acute PE or dissection or ACS is quite low.  ECG is without acute ischemia.  Troponin is negative after 8+ hours of continuous pain.  D-dimer is negative and otherwise low risk PE.  No midline back pain/tenderness and no neurodeficits to be suspicious for acute spinal cord emergency.  I do not think an emergent condition is ongoing and I think he is stable for discharge home with supportive care.  Recommended heat, NSAIDs, Tylenol.  Given return precautions Final Clinical Impression(s) / ED Diagnoses Final diagnoses:  Strain of thoracic back region  Nonspecific chest pain    Rx / DC Orders ED Discharge Orders     None        Pricilla Loveless, MD 08/22/21 [phone]

## 2021-08-22 NOTE — Discharge Instructions (Addendum)
If you develop worsening, recurrent, or continued back pain, numbness or weakness in the legs, incontinence of your bowels or bladders, numbness of your buttocks, fever, abdominal pain, or any other new/concerning symptoms then return to the ER for evaluation.  

## 2021-11-25 ENCOUNTER — Encounter (HOSPITAL_BASED_OUTPATIENT_CLINIC_OR_DEPARTMENT_OTHER): Payer: Self-pay | Admitting: *Deleted

## 2021-11-25 ENCOUNTER — Emergency Department (HOSPITAL_BASED_OUTPATIENT_CLINIC_OR_DEPARTMENT_OTHER)
Admission: EM | Admit: 2021-11-25 | Discharge: 2021-11-25 | Disposition: A | Discharge status: Home / Self Care | Payer: BC Managed Care – PPO | Attending: Emergency Medicine | Admitting: Emergency Medicine

## 2021-11-25 ENCOUNTER — Other Ambulatory Visit: Payer: Self-pay

## 2021-11-25 DIAGNOSIS — S0101XA Laceration without foreign body of scalp, initial encounter: Secondary | ICD-10-CM | POA: Insufficient documentation

## 2021-11-25 DIAGNOSIS — S0990XA Unspecified injury of head, initial encounter: Secondary | ICD-10-CM | POA: Diagnosis not present

## 2021-11-25 DIAGNOSIS — Z23 Encounter for immunization: Secondary | ICD-10-CM | POA: Insufficient documentation

## 2021-11-25 DIAGNOSIS — W268XXA Contact with other sharp object(s), not elsewhere classified, initial encounter: Secondary | ICD-10-CM | POA: Diagnosis not present

## 2021-11-25 MED ORDER — TETANUS-DIPHTH-ACELL PERTUSSIS 5-2.5-18.5 LF-MCG/0.5 IM SUSY
0.5000 mL | PREFILLED_SYRINGE | Freq: Once | INTRAMUSCULAR | Status: AC
  Administered 2021-11-25: 0.5 mL via INTRAMUSCULAR
  Filled 2021-11-25: qty 0.5

## 2021-11-25 NOTE — ED Provider Notes (Signed)
[facility] EMERGENCY DEPT Provider Note   CSN: [id number] Arrival date & time: 11/25/21  0900     History  Chief Complaint  Patient presents with   Laceration    Victor King is a 41 y.o. male.   Laceration Patient presents with head injury.  States that he has had last night on a piece of metal.  Little nauseous with time but that is resolved.  No loss conscious.  Not on blood thinners.  Has laceration of scalp.  For checkup.  Unknown last tetanus but has been likely more than 5 years.  Awake and appropriate.  No numbness weakness.  No neck pain.    Home Medications Prior to Admission medications   Medication Sig Start Date End Date Taking? Authorizing Provider  albuterol (VENTOLIN HFA) 108 (90 Base) MCG/ACT inhaler Inhale 1-2 puffs into the lungs every 4 (four) hours as needed for wheezing. 05/30/21   Early, Sung Amabile, NP  fluticasone (FLONASE) 50 MCG/ACT nasal spray Place 2 sprays into both nostrils daily. 05/30/21   Early, Sung Amabile, NP  montelukast (SINGULAIR) 10 MG tablet Take 1 tablet (10 mg total) by mouth at bedtime. 05/30/21   Tollie Eth, NP  pantoprazole (PROTONIX) 40 MG tablet Take 1 tablet (40 mg total) by mouth daily. 05/30/21   Tollie Eth, NP      Allergies    Patient has no known allergies.    Review of Systems   Review of Systems  Constitutional:  Negative for appetite change.  HENT:  Negative for congestion.   Gastrointestinal:  Positive for nausea.  Neurological:  Negative for numbness and headaches.   Physical Exam Updated Vital Signs BP 116/88    Pulse (!) 55 Comment: patient comfortable   Temp 98.4 F (36.9 C)    Resp 14    Ht 5\' 3"  (1.6 m)    Wt 56.7 kg    SpO2 100%    BMI 22.14 kg/m  Physical Exam Vitals and nursing note reviewed.  HENT:     Head:     Comments: Approximately 7 to 8 cm laceration across mid to posterior scalp.  The medial aspect of it was more deep with the more inferior aspect was rather superficial.  Does go down  into his hair.  No underlying bony tenderness.  Slight swelling. Cardiovascular:     Rate and Rhythm: Regular rhythm.  Neurological:     Mental Status: He is alert.    ED Results / Procedures / Treatments   Labs (all labs ordered are listed, but only abnormal results are displayed) Labs Reviewed - No data to display  EKG None  Radiology No results found.  Procedures . Laceration Repair  Date/Time: 11/25/2021 12:00 PM Performed by: 01/23/2022, MD Authorized by: Benjiman Core, MD   Consent:    Consent obtained:  Verbal   Consent given by:  Patient   Risks discussed:  Retained foreign body, pain, infection, need for additional repair, nerve damage, poor wound healing and poor cosmetic result   Alternatives discussed:  No treatment, delayed treatment and observation Universal protocol:    Patient identity confirmed:  Verbally with patient Anesthesia:    Anesthesia method:  None Laceration details:    Location:  Scalp   Scalp location:  R parietal   Length (cm):  7 Exploration:    Limited defect created (wound extended): no   Treatment:    Wound cleansed with: Alcohol.   Amount of cleaning:  Standard Skin  repair:    Repair method:  Tissue adhesive Approximation:    Approximation:  Close Repair type:    Repair type:  Simple Post-procedure details:    Dressing:  Open (no dressing)   Procedure completion:  Tolerated well, no immediate complications    Medications Ordered in ED Medications  Tdap (BOOSTRIX) injection 0.5 mL (0.5 mLs Intramuscular Given 11/25/21 1151)    ED Course/ Medical Decision Making/ A&P                           Medical Decision Making Patient with scalp laceration.  Hit head.  Wounds were last night.  Wound overall rather well approximated but Dermabond placed on the medial aspect of it to help keep it together.  Tetanus updated.  Doubt severe intracranial injury.  Cervical spine clinically cleared.  Will discharge home.  Initial  differential diagnosis included laceration, severe laceration, intracranial hemorrhage, head injury.  Problems Addressed: Laceration of scalp, initial encounter: acute illness or injury Minor head injury, initial encounter: acute illness or injury  Risk Decision regarding hospitalization.           Final Clinical Impression(s) / ED Diagnoses Final diagnoses:  Minor head injury, initial encounter  Laceration of scalp, initial encounter    Rx / DC Orders ED Discharge Orders     None         Benjiman Core, MD 11/25/21 1201

## 2021-11-25 NOTE — ED Triage Notes (Signed)
Pt cut top of head on metal bar at work last night, bleeding controlled, Approx 3 inch laceration with straight edges.

## 2021-11-28 ENCOUNTER — Other Ambulatory Visit: Payer: Self-pay

## 2021-11-28 ENCOUNTER — Emergency Department (HOSPITAL_BASED_OUTPATIENT_CLINIC_OR_DEPARTMENT_OTHER)
Admission: EM | Admit: 2021-11-28 | Discharge: 2021-11-28 | Disposition: A | Discharge status: Home / Self Care | Payer: BC Managed Care – PPO | Attending: Emergency Medicine | Admitting: Emergency Medicine

## 2021-11-28 ENCOUNTER — Encounter (HOSPITAL_BASED_OUTPATIENT_CLINIC_OR_DEPARTMENT_OTHER): Payer: Self-pay

## 2021-11-28 DIAGNOSIS — Y92009 Unspecified place in unspecified non-institutional (private) residence as the place of occurrence of the external cause: Secondary | ICD-10-CM | POA: Diagnosis not present

## 2021-11-28 DIAGNOSIS — S01511A Laceration without foreign body of lip, initial encounter: Secondary | ICD-10-CM | POA: Diagnosis not present

## 2021-11-28 DIAGNOSIS — W5503XA Scratched by cat, initial encounter: Secondary | ICD-10-CM | POA: Insufficient documentation

## 2021-11-28 MED ORDER — IBUPROFEN 400 MG PO TABS
600.0000 mg | ORAL_TABLET | Freq: Once | ORAL | Status: AC
  Administered 2021-11-28: 600 mg via ORAL
  Filled 2021-11-28: qty 1

## 2021-11-28 MED ORDER — LIDOCAINE-EPINEPHRINE-TETRACAINE (LET) TOPICAL GEL
3.0000 mL | Freq: Once | TOPICAL | Status: AC
  Administered 2021-11-28: 3 mL via TOPICAL
  Filled 2021-11-28: qty 3

## 2021-11-28 MED ORDER — AZITHROMYCIN 250 MG PO TABS: 250.0000 mg | ORAL_TABLET | Freq: Every day | ORAL | 0 refills | Status: DC

## 2021-11-28 MED ORDER — AZITHROMYCIN 250 MG PO TABS
500.0000 mg | ORAL_TABLET | Freq: Once | ORAL | Status: AC
  Administered 2021-11-28: 500 mg via ORAL
  Filled 2021-11-28: qty 2

## 2021-11-28 NOTE — Discharge Instructions (Addendum)
You were seen here today for evaluation of a laceration to your lip here. Cats can carry bacteria, so make sure you keep the wound clean with soap and water. Apply Aquaphor or Vaseline to the laceration after cleaning it. I have also prescribed you an antibiotic that you will need to take once a day at night for the next four days as I gave you your first dose here. Please return to your PCP or the ER in 7 days for removal of the sutures. You can take tylenol or ibuprofen as needed for pain. If you get a fever, abnormal discharge from the wound, swelling, or increased pain, please return to the ER for re-evaluation.

## 2021-11-28 NOTE — ED Provider Notes (Signed)
[facility] EMERGENCY DEPT Provider Note   CSN: [id number] Arrival date & time: 11/28/21  1630     History  Chief Complaint  Patient presents with   Laceration    Victor King is a 41 y.o. male presents to the ED for evaluation of lip laceration after his cat scratched him pta. He reports he has an updated tetanus from just a few weeks ago. Denies any bites or scratches anywhere else. Medical history of GERD and seasonal allergies. No pertinent surgeries. Daily medications include albuterol, Flonase, Protonix, and Singulair. NKDA. Denies any tobacco, EtOH, or illicit drug use ever.    Laceration Associated symptoms: no fever and no rash       Home Medications Prior to Admission medications   Medication Sig Start Date End Date Taking? Authorizing Provider  albuterol (VENTOLIN HFA) 108 (90 Base) MCG/ACT inhaler Inhale 1-2 puffs into the lungs every 4 (four) hours as needed for wheezing. 05/30/21   Early, Coralee Pesa, NP  fluticasone (FLONASE) 50 MCG/ACT nasal spray Place 2 sprays into both nostrils daily. 05/30/21   Early, Coralee Pesa, NP  montelukast (SINGULAIR) 10 MG tablet Take 1 tablet (10 mg total) by mouth at bedtime. 05/30/21   Orma Render, NP  pantoprazole (PROTONIX) 40 MG tablet Take 1 tablet (40 mg total) by mouth daily. 05/30/21   Orma Render, NP      Allergies    Patient has no known allergies.    Review of Systems   Review of Systems  Constitutional:  Negative for chills and fever.  HENT:  Negative for ear pain and sore throat.   Eyes:  Negative for pain and visual disturbance.  Respiratory:  Negative for cough and shortness of breath.   Cardiovascular:  Negative for chest pain and palpitations.  Gastrointestinal:  Negative for abdominal pain and vomiting.  Genitourinary:  Negative for dysuria and hematuria.  Musculoskeletal:  Negative for arthralgias and back pain.  Skin:  Positive for wound. Negative for color change and rash.  Neurological:  Negative  for seizures and syncope.  All other systems reviewed and are negative.  Physical Exam Updated Vital Signs BP (!) 129/107    Pulse 95    Temp (!) 97.2 F (36.2 C)    Resp 16    Ht 5\' 3"  (1.6 m)    Wt 56.7 kg    SpO2 100%    BMI 22.14 kg/m  Physical Exam Vitals and nursing note reviewed.  Constitutional:      General: He is not in acute distress.    Appearance: Normal appearance. He is not toxic-appearing.  HENT:     Head: Normocephalic.     Mouth/Throat:     Mouth: Mucous membranes are moist.     Comments: 1.5cm laceration to the lower lip involving the [place] border.  Eyes:     General: No scleral icterus. Pulmonary:     Effort: Pulmonary effort is normal. No respiratory distress.  Skin:    General: Skin is dry.     Findings: No rash.  Neurological:     General: No focal deficit present.     Mental Status: He is alert. Mental status is at baseline.  Psychiatric:        Mood and Affect: Mood normal.    ED Results / Procedures / Treatments   Labs (all labs ordered are listed, but only abnormal results are displayed) Labs Reviewed - No data to display  EKG None  Radiology No  results found.  Procedures .Marland KitchenLaceration Repair  Date/Time: 11/30/2021 1:40 PM Performed by: Sherrell Puller, PA-C Authorized by: Sherrell Puller, PA-C   Consent:    Consent obtained:  Verbal   Consent given by:  Patient   Risks, benefits, and alternatives were discussed: yes     Risks discussed:  Infection, pain, poor cosmetic result and need for additional repair   Alternatives discussed:  No treatment Universal protocol:    Procedure explained and questions answered to patient or proxy's satisfaction: yes     Relevant documents present and verified: no     Test results available: no     Imaging studies available: no     Required blood products, implants, devices, and special equipment available: no     Site/side marked: no     Immediately prior to procedure, a time out was called: no      Patient identity confirmed:  Verbally with patient Anesthesia:    Anesthesia method:  Topical application   Topical anesthetic:  LET Laceration details:    Location:  Lip   Lip location:  Lower exterior lip   Length (cm):  1.5   Depth (mm):  2 Pre-procedure details:    Preparation:  Patient was prepped and draped in usual sterile fashion Exploration:    Hemostasis achieved with:  LET   Wound exploration: wound explored through full range of motion and entire depth of wound visualized     Contaminated: no   Treatment:    Area cleansed with:  Chlorhexidine and saline   Amount of cleaning:  Extensive   Irrigation solution:  Sterile saline   Irrigation volume:  569ml   Irrigation method:  Tap and syringe Skin repair:    Repair method:  Sutures   Suture size:  6-0   Suture material:  Prolene   Suture technique:  Simple interrupted   Number of sutures:  2 Approximation:    Approximation:  Close   [place] border well-aligned: yes   Repair type:    Repair type:  Simple Post-procedure details:    Dressing:  Open (no dressing)   Procedure completion:  Tolerated well, no immediate complications  Slight increased blood pressure. Lower temperature.   Medications Ordered in ED Medications  ibuprofen (ADVIL) tablet 600 mg (600 mg Oral Given 11/28/21 1744)    ED Course/ Medical Decision Making/ A&P                           Medical Decision Making  41 year old male presents to the emergency department for evaluation of lip laceration from his cat.  Up-to-date on tetanus.  Vermilion border involved.  Vermilion border well aligned with 2 sutures of 6-0 Prolene.  Full depth of the wound visualized and no foreign body seen.  Extensively cleaned.  We will place patient on azithromycin given the mechanism of his injury.  Recommended the patient keep clean with warm soapy water at least twice a day.  Recommended follow-up in 7 days for suture removal.  Return precautions discussed.   Patient agrees with plan.  Patient stable being discharged home in good condition.  I discussed this case with my attending physician who cosigned this note including patient's presenting symptoms, physical exam, and planned diagnostics and interventions. Attending physician stated agreement with plan or made changes to plan which were implemented.   Final Clinical Impression(s) / ED Diagnoses Final diagnoses:  Lip laceration, initial encounter    Rx / DC  Orders ED Discharge Orders          Ordered    azithromycin (ZITHROMAX) 250 MG tablet  Daily        11/28/21 1956              Sherrell Puller, Hershal Coria 11/30/21 1348    Luna Fuse, MD 11/30/21 2108

## 2021-11-28 NOTE — ED Triage Notes (Signed)
Patient here POV from Home with Laceration.  Patient obtained 2 cm Laceration to Lower Mid Lip from Pet Cat (Scratch). Bleeding Controlled.  Patient is UTD on Tetanus. NAD Noted during Triage. A&Ox4. GCS 15. Ambulatory.

## 2021-12-12 ENCOUNTER — Encounter (HOSPITAL_BASED_OUTPATIENT_CLINIC_OR_DEPARTMENT_OTHER): Payer: Self-pay | Admitting: Emergency Medicine

## 2021-12-12 ENCOUNTER — Emergency Department (HOSPITAL_BASED_OUTPATIENT_CLINIC_OR_DEPARTMENT_OTHER)
Admission: EM | Admit: 2021-12-12 | Discharge: 2021-12-12 | Disposition: A | Discharge status: Home / Self Care | Payer: BC Managed Care – PPO | Attending: Emergency Medicine | Admitting: Emergency Medicine

## 2021-12-12 ENCOUNTER — Other Ambulatory Visit: Payer: Self-pay

## 2021-12-12 DIAGNOSIS — Z4802 Encounter for removal of sutures: Secondary | ICD-10-CM | POA: Insufficient documentation

## 2021-12-12 NOTE — ED Provider Notes (Signed)
[place] EMERGENCY DEPT Provider Note   CSN: [id number] Arrival date & time: 12/12/21  1420     History  Chief Complaint  Patient presents with   Suture / Staple Removal    Victor King is a 41 y.o. male who presents to the ED for suture removal.  Patient had a lip laceration repair on 1/9 and returns for suture removal.  No fever or chills.  No purulent drainage.  No complaints.     Home Medications Prior to Admission medications   Medication Sig Start Date End Date Taking? Authorizing Provider  albuterol (VENTOLIN HFA) 108 (90 Base) MCG/ACT inhaler Inhale 1-2 puffs into the lungs every 4 (four) hours as needed for wheezing. 05/30/21   Early, Coralee Pesa, NP  azithromycin (ZITHROMAX) 250 MG tablet Take 1 tablet (250 mg total) by mouth daily. Take 1 pill for the next four days. 11/28/21   Sherrell Puller, PA-C  fluticasone (FLONASE) 50 MCG/ACT nasal spray Place 2 sprays into both nostrils daily. 05/30/21   Early, Coralee Pesa, NP  montelukast (SINGULAIR) 10 MG tablet Take 1 tablet (10 mg total) by mouth at bedtime. 05/30/21   Orma Render, NP  pantoprazole (PROTONIX) 40 MG tablet Take 1 tablet (40 mg total) by mouth daily. 05/30/21   Orma Render, NP      Allergies    Patient has no known allergies.    Review of Systems   Review of Systems  Constitutional:  Negative for chills and fever.  Skin:  Positive for wound.   Physical Exam Updated Vital Signs BP (!) 141/88 (BP Location: Right Arm)    Pulse 78    Temp 98.8 F (37.1 C) (Oral)    Resp 18    Ht 5\' 4"  (1.626 m)    Wt 59 kg    SpO2 98%    BMI 22.31 kg/m  Physical Exam Vitals and nursing note reviewed.  Constitutional:      General: He is not in acute distress.    Appearance: He is not ill-appearing.  HENT:     Head: Normocephalic.     Mouth/Throat:     Comments: Well healing wound to lower lip. No purulent drainage. No edema Eyes:     Pupils: Pupils are equal, round, and reactive to light.  Cardiovascular:      Rate and Rhythm: Normal rate and regular rhythm.     Pulses: Normal pulses.     Heart sounds: Normal heart sounds. No murmur heard.   No friction rub. No gallop.  Pulmonary:     Effort: Pulmonary effort is normal.     Breath sounds: Normal breath sounds.  Abdominal:     General: Abdomen is flat. There is no distension.     Palpations: Abdomen is soft.     Tenderness: There is no abdominal tenderness. There is no guarding or rebound.  Musculoskeletal:        General: Normal range of motion.     Cervical back: Neck supple.  Skin:    General: Skin is warm and dry.  Neurological:     General: No focal deficit present.     Mental Status: He is alert.  Psychiatric:        Mood and Affect: Mood normal.        Behavior: Behavior normal.    ED Results / Procedures / Treatments   Labs (all labs ordered are listed, but only abnormal results are displayed) Labs Reviewed - No data  to display  EKG None  Radiology No results found.  Procedures Procedures    Medications Ordered in ED Medications - No data to display  ED Course/ Medical Decision Making/ A&P                           Medical Decision Making  41 year old male presents to the ED for to suture removal.  Patient had sutures placed on 1/9 to his bottom lip.  No fever or chills.  No purulent drainage.  Well healing wound without signs of infection.  Sutures removed. Strict ED precautions discussed with patient. Patient states understanding and agrees to plan. Patient discharged home in no acute distress and stable vitals        Final Clinical Impression(s) / ED Diagnoses Final diagnoses:  Visit for suture removal    Rx / DC Orders ED Discharge Orders     None         Suzy Bouchard, PA-C 12/12/21 [place], DO 12/13/21 [phone]

## 2021-12-12 NOTE — ED Triage Notes (Signed)
Pt via pov from home for suture removal from bottom lip. Pt alert :& oriented, nad noted.

## 2021-12-12 NOTE — ED Notes (Signed)
Pt discharged home after verbalizing understanding of discharge instructions; nad noted. 

## 2022-02-27 DIAGNOSIS — F411 Generalized anxiety disorder: Secondary | ICD-10-CM | POA: Diagnosis not present

## 2022-05-29 DIAGNOSIS — M40292 Other kyphosis, cervical region: Secondary | ICD-10-CM | POA: Diagnosis not present

## 2022-05-29 DIAGNOSIS — M9904 Segmental and somatic dysfunction of sacral region: Secondary | ICD-10-CM | POA: Diagnosis not present

## 2022-05-29 DIAGNOSIS — M546 Pain in thoracic spine: Secondary | ICD-10-CM | POA: Diagnosis not present

## 2022-05-29 DIAGNOSIS — M9905 Segmental and somatic dysfunction of pelvic region: Secondary | ICD-10-CM | POA: Diagnosis not present

## 2022-05-29 DIAGNOSIS — M5441 Lumbago with sciatica, right side: Secondary | ICD-10-CM | POA: Diagnosis not present

## 2022-05-31 DIAGNOSIS — M9905 Segmental and somatic dysfunction of pelvic region: Secondary | ICD-10-CM | POA: Diagnosis not present

## 2022-05-31 DIAGNOSIS — M546 Pain in thoracic spine: Secondary | ICD-10-CM | POA: Diagnosis not present

## 2022-05-31 DIAGNOSIS — M9904 Segmental and somatic dysfunction of sacral region: Secondary | ICD-10-CM | POA: Diagnosis not present

## 2022-05-31 DIAGNOSIS — M9901 Segmental and somatic dysfunction of cervical region: Secondary | ICD-10-CM | POA: Diagnosis not present

## 2022-06-05 ENCOUNTER — Encounter (HOSPITAL_BASED_OUTPATIENT_CLINIC_OR_DEPARTMENT_OTHER): Payer: BC Managed Care – PPO | Admitting: Nurse Practitioner

## 2022-06-05 DIAGNOSIS — M9905 Segmental and somatic dysfunction of pelvic region: Secondary | ICD-10-CM | POA: Diagnosis not present

## 2022-06-05 DIAGNOSIS — M9904 Segmental and somatic dysfunction of sacral region: Secondary | ICD-10-CM | POA: Diagnosis not present

## 2022-06-05 DIAGNOSIS — M546 Pain in thoracic spine: Secondary | ICD-10-CM | POA: Diagnosis not present

## 2022-06-08 DIAGNOSIS — M9905 Segmental and somatic dysfunction of pelvic region: Secondary | ICD-10-CM | POA: Diagnosis not present

## 2022-06-08 DIAGNOSIS — M546 Pain in thoracic spine: Secondary | ICD-10-CM | POA: Diagnosis not present

## 2022-06-08 DIAGNOSIS — M9904 Segmental and somatic dysfunction of sacral region: Secondary | ICD-10-CM | POA: Diagnosis not present

## 2022-06-12 DIAGNOSIS — M9904 Segmental and somatic dysfunction of sacral region: Secondary | ICD-10-CM | POA: Diagnosis not present

## 2022-06-12 DIAGNOSIS — M546 Pain in thoracic spine: Secondary | ICD-10-CM | POA: Diagnosis not present

## 2022-06-12 DIAGNOSIS — M9905 Segmental and somatic dysfunction of pelvic region: Secondary | ICD-10-CM | POA: Diagnosis not present

## 2022-06-13 ENCOUNTER — Encounter (HOSPITAL_BASED_OUTPATIENT_CLINIC_OR_DEPARTMENT_OTHER): Payer: Self-pay | Admitting: Nurse Practitioner

## 2022-06-15 DIAGNOSIS — M546 Pain in thoracic spine: Secondary | ICD-10-CM | POA: Diagnosis not present

## 2022-06-15 DIAGNOSIS — M9905 Segmental and somatic dysfunction of pelvic region: Secondary | ICD-10-CM | POA: Diagnosis not present

## 2022-06-15 DIAGNOSIS — M9904 Segmental and somatic dysfunction of sacral region: Secondary | ICD-10-CM | POA: Diagnosis not present

## 2022-06-19 DIAGNOSIS — M546 Pain in thoracic spine: Secondary | ICD-10-CM | POA: Diagnosis not present

## 2022-06-19 DIAGNOSIS — M9904 Segmental and somatic dysfunction of sacral region: Secondary | ICD-10-CM | POA: Diagnosis not present

## 2022-06-19 DIAGNOSIS — M9905 Segmental and somatic dysfunction of pelvic region: Secondary | ICD-10-CM | POA: Diagnosis not present

## 2022-06-23 DIAGNOSIS — M546 Pain in thoracic spine: Secondary | ICD-10-CM | POA: Diagnosis not present

## 2022-06-23 DIAGNOSIS — M9905 Segmental and somatic dysfunction of pelvic region: Secondary | ICD-10-CM | POA: Diagnosis not present

## 2022-06-23 DIAGNOSIS — M9904 Segmental and somatic dysfunction of sacral region: Secondary | ICD-10-CM | POA: Diagnosis not present

## 2022-06-26 DIAGNOSIS — M546 Pain in thoracic spine: Secondary | ICD-10-CM | POA: Diagnosis not present

## 2022-06-26 DIAGNOSIS — M9905 Segmental and somatic dysfunction of pelvic region: Secondary | ICD-10-CM | POA: Diagnosis not present

## 2022-06-26 DIAGNOSIS — M9903 Segmental and somatic dysfunction of lumbar region: Secondary | ICD-10-CM | POA: Diagnosis not present

## 2022-07-03 DIAGNOSIS — M546 Pain in thoracic spine: Secondary | ICD-10-CM | POA: Diagnosis not present

## 2022-07-03 DIAGNOSIS — M9905 Segmental and somatic dysfunction of pelvic region: Secondary | ICD-10-CM | POA: Diagnosis not present

## 2022-07-03 DIAGNOSIS — M9903 Segmental and somatic dysfunction of lumbar region: Secondary | ICD-10-CM | POA: Diagnosis not present

## 2022-07-06 DIAGNOSIS — M546 Pain in thoracic spine: Secondary | ICD-10-CM | POA: Diagnosis not present

## 2022-07-06 DIAGNOSIS — M9903 Segmental and somatic dysfunction of lumbar region: Secondary | ICD-10-CM | POA: Diagnosis not present

## 2022-07-06 DIAGNOSIS — M9905 Segmental and somatic dysfunction of pelvic region: Secondary | ICD-10-CM | POA: Diagnosis not present

## 2022-07-10 DIAGNOSIS — M9903 Segmental and somatic dysfunction of lumbar region: Secondary | ICD-10-CM | POA: Diagnosis not present

## 2022-07-10 DIAGNOSIS — M546 Pain in thoracic spine: Secondary | ICD-10-CM | POA: Diagnosis not present

## 2022-07-10 DIAGNOSIS — M40292 Other kyphosis, cervical region: Secondary | ICD-10-CM | POA: Diagnosis not present

## 2022-07-10 DIAGNOSIS — M9905 Segmental and somatic dysfunction of pelvic region: Secondary | ICD-10-CM | POA: Diagnosis not present

## 2022-07-12 DIAGNOSIS — M9905 Segmental and somatic dysfunction of pelvic region: Secondary | ICD-10-CM | POA: Diagnosis not present

## 2022-07-12 DIAGNOSIS — M9903 Segmental and somatic dysfunction of lumbar region: Secondary | ICD-10-CM | POA: Diagnosis not present

## 2022-07-12 DIAGNOSIS — M546 Pain in thoracic spine: Secondary | ICD-10-CM | POA: Diagnosis not present

## 2022-07-17 DIAGNOSIS — M9905 Segmental and somatic dysfunction of pelvic region: Secondary | ICD-10-CM | POA: Diagnosis not present

## 2022-07-17 DIAGNOSIS — M9903 Segmental and somatic dysfunction of lumbar region: Secondary | ICD-10-CM | POA: Diagnosis not present

## 2022-07-17 DIAGNOSIS — M546 Pain in thoracic spine: Secondary | ICD-10-CM | POA: Diagnosis not present

## 2022-07-20 DIAGNOSIS — M9905 Segmental and somatic dysfunction of pelvic region: Secondary | ICD-10-CM | POA: Diagnosis not present

## 2022-07-20 DIAGNOSIS — M546 Pain in thoracic spine: Secondary | ICD-10-CM | POA: Diagnosis not present

## 2022-07-20 DIAGNOSIS — M9903 Segmental and somatic dysfunction of lumbar region: Secondary | ICD-10-CM | POA: Diagnosis not present

## 2022-07-25 DIAGNOSIS — M5441 Lumbago with sciatica, right side: Secondary | ICD-10-CM | POA: Diagnosis not present

## 2022-07-25 DIAGNOSIS — M40292 Other kyphosis, cervical region: Secondary | ICD-10-CM | POA: Diagnosis not present

## 2022-07-25 DIAGNOSIS — M9903 Segmental and somatic dysfunction of lumbar region: Secondary | ICD-10-CM | POA: Diagnosis not present

## 2022-07-25 DIAGNOSIS — M9905 Segmental and somatic dysfunction of pelvic region: Secondary | ICD-10-CM | POA: Diagnosis not present

## 2022-07-28 DIAGNOSIS — M546 Pain in thoracic spine: Secondary | ICD-10-CM | POA: Diagnosis not present

## 2022-07-28 DIAGNOSIS — M9903 Segmental and somatic dysfunction of lumbar region: Secondary | ICD-10-CM | POA: Diagnosis not present

## 2022-07-28 DIAGNOSIS — M9905 Segmental and somatic dysfunction of pelvic region: Secondary | ICD-10-CM | POA: Diagnosis not present

## 2022-11-06 ENCOUNTER — Encounter (HOSPITAL_COMMUNITY): Payer: Self-pay

## 2022-11-06 DIAGNOSIS — I469 Cardiac arrest, cause unspecified: Principal | ICD-10-CM

## 2022-11-06 DIAGNOSIS — T50901A Poisoning by unspecified drugs, medicaments and biological substances, accidental (unintentional), initial encounter: Secondary | ICD-10-CM | POA: Diagnosis present

## 2022-11-06 NOTE — ED Provider Notes (Incomplete)
[facility] EMERGENCY DEPARTMENT Provider Note   CSN: [id number] Arrival date & time: 11/06/22  2014     History {Add pertinent medical, surgical, social history, OB history to HPI:1} Chief Complaint  Patient presents with  . post cpr    Victor King is a 41 y.o. male.   Cardiac Arrest Witnessed by:  Bystander Incident location: community. Time since incident:  1 hour Time before BLS initiated:  3-5 minutes Time before ALS initiated:  8-10 minutes Condition upon EMS arrival:  Apneic and unresponsive Pulse:  Absent Initial cardiac rhythm per EMS:  PEA (slow, narrow complex) Treatments prior to arrival:  ACLS protocol and intubation Medications given prior to ED:  Epinephrine Airway:  Intubation prior to arrival Rhythm on admission to ED:  Sinus tachycardia  Bystander witnessed cardiac arrest after patient snorted unknown substance. He turned blue and collapsed, fire was present on scene within 5 minutes and started CPR. Initial rhythm PEA with slow, narrow complex. At one point was Vfib, shocked, no ROSC. Went back into PEA. Received total of CPR for about 15 minutes, epi x3. Achieved ROSC, was intubated during CPR. Was also given 3 mg narcan. After, was slightly agitated so he received versed and fentanyl en route. Initial post ROSC ECG showed concerns for STEMI with ST elevations in leads aVR and V1, diffuse ST depressions.    Home Medications Prior to Admission medications   Medication Sig Start Date End Date Taking? Authorizing Provider  albuterol (VENTOLIN HFA) 108 (90 Base) MCG/ACT inhaler Inhale 1-2 puffs into the lungs every 4 (four) hours as needed for wheezing. 05/30/21   Early, Sung Amabile, NP  azithromycin (ZITHROMAX) 250 MG tablet Take 1 tablet (250 mg total) by mouth daily. Take 1 pill for the next four days. 11/28/21   Achille Rich, PA-C  fluticasone (FLONASE) 50 MCG/ACT nasal spray Place 2 sprays into both nostrils daily. 05/30/21   Early, Sung Amabile,  NP  montelukast (SINGULAIR) 10 MG tablet Take 1 tablet (10 mg total) by mouth at bedtime. 05/30/21   Tollie Eth, NP  pantoprazole (PROTONIX) 40 MG tablet Take 1 tablet (40 mg total) by mouth daily. 05/30/21   Tollie Eth, NP      Allergies    Patient has no known allergies.    Review of Systems   Review of Systems Negative except as per HPI Physical Exam Updated Vital Signs There were no vitals taken for this visit. Physical Exam Vitals and nursing note reviewed.  Constitutional:      Appearance: He is well-developed.     Interventions: He is intubated.  HENT:     Head: Normocephalic and atraumatic.  Eyes:     Conjunctiva/sclera: Conjunctivae normal.     Pupils: Pupils are equal, round, and reactive to light.     Comments: Pupils 3 mm minimally reactive  Cardiovascular:     Rate and Rhythm: Regular rhythm. Tachycardia present.     Pulses:          Carotid pulses are 2+ on the right side and 2+ on the left side.      Radial pulses are 2+ on the right side and 2+ on the left side.       Femoral pulses are 2+ on the right side and 2+ on the left side.      Dorsalis pedis pulses are 2+ on the right side and 2+ on the left side.     Heart sounds: No murmur  heard. Pulmonary:     Effort: Pulmonary effort is normal. No respiratory distress. He is intubated.     Breath sounds: Normal breath sounds.  Abdominal:     General: There is no distension.     Palpations: Abdomen is soft.     Tenderness: There is no abdominal tenderness. There is no guarding or rebound.  Musculoskeletal:        General: No swelling.     Cervical back: Neck supple.     Right lower leg: No edema.     Left lower leg: No edema.  Skin:    General: Skin is warm and dry.     Capillary Refill: Capillary refill takes less than 2 seconds.  Neurological:     Cranial Nerves: No facial asymmetry.  Psychiatric:        Mood and Affect: Mood normal.     ED Results / Procedures / Treatments   Labs (all labs  ordered are listed, but only abnormal results are displayed) Labs Reviewed - No data to display  EKG None  Radiology No results found.  Procedures .Critical Care  Performed by: Gust Brooms, MD Authorized by: Derwood Kaplan, MD   Critical care provider statement:    Critical care time (minutes):  35   Critical care start time:  11/06/2022 8:15 PM   Critical care time was exclusive of:  Separately billable procedures and treating other patients and teaching time   Critical care was necessary to treat or prevent imminent or life-threatening deterioration of the following conditions:  Cardiac failure and respiratory failure   Critical care was time spent personally by me on the following activities:  Ordering and performing treatments and interventions, ordering and review of laboratory studies, ordering and review of radiographic studies, pulse oximetry, re-evaluation of patient's condition, ventilator management, examination of patient and discussions with consultants   Care discussed with: admitting provider       Medications Ordered in ED Medications - No data to display  ED Course/ Medical Decision Making/ A&P                           Medical Decision Making Amount and/or Complexity of Data Reviewed Labs: ordered. Radiology: ordered.  Risk Prescription drug management. Decision regarding hospitalization.   Patient presenting after cardiac arrest. Suspected arrest from respiratory failure after drug intoxication. Initially PEA, briefly Vfib, then PEA until ROSC prior to hospital arrival. Intubated prior to arrival. Post ROSC ECG concerning for STEMI with ST elevations in leads aVR and V1, diffuse ST depressions.  On arrival, patient is intubated, initiating occasional spontaneous breaths. He is tachycardic around 120. ECG obtained which per my review shows sinus tachycardia. The ST changes prior to arrival have resolved. CXR obtained which showed intact ETT, KUB obtained  which shows normal positioning of OG tube. CT head obtained, showing no evidence of intracranial injury/bleeding/hydrocephalus. There is no significant blunting of the gray/white matter, no significant signs of anoxic brain injury.   Labs significant for positive cocaine and THC, significant lactic acidemia, signs of shock liver/acute liver injury from his hypoxia, slightly elevated troponin at 46, likely secondary to cardiac arrest. Low suspicion for primary cardiac event.   {Document critical care time when appropriate:1} {Document review of labs and clinical decision tools ie heart score, Chads2Vasc2 etc:1}  {Document your independent review of radiology images, and any outside records:1} {Document your discussion with family members, caretakers, and with consultants:1} {Document social determinants  of health affecting pt's care:1} {Document your decision making why or why not admission, treatments were needed:1} Final Clinical Impression(s) / ED Diagnoses Final diagnoses:  None    Rx / DC Orders ED Discharge Orders     None

## 2022-11-06 NOTE — ED Triage Notes (Signed)
Pt to ED via [facility] after witnessed arrest. Per bystanders, pt snorted a line of unknown substance and then turned blue and collapsed. When fire dept arrived 4-5 minutes after call, they started CPR at 1920, Pt was noted to be in PEA with a narrow complex rhythm at a rate in the 40s, pt received epi, went into vfib and was shocked 1 time.  Pt then went back into PEA and was given 2 more doses of epi and ROSC was obtained at 1935. Pt also received narcan 3mg  total. Pt was intubated with a 8.0 ETT, secured at 26 at teeth. Pt was given versed 5mg  and fent en route when he started moving. Fsbs=184.

## 2022-11-06 NOTE — ED Notes (Signed)
RT at bedside.

## 2022-11-07 ENCOUNTER — Encounter (HOSPITAL_BASED_OUTPATIENT_CLINIC_OR_DEPARTMENT_OTHER): Payer: Self-pay | Admitting: Emergency Medicine

## 2022-11-07 ENCOUNTER — Emergency Department (HOSPITAL_BASED_OUTPATIENT_CLINIC_OR_DEPARTMENT_OTHER)
Admission: EM | Admit: 2022-11-07 | Discharge: 2022-11-07 | Disposition: A | Discharge status: Home / Self Care | Payer: BC Managed Care – PPO | Attending: Emergency Medicine | Admitting: Emergency Medicine

## 2022-11-07 ENCOUNTER — Emergency Department (HOSPITAL_BASED_OUTPATIENT_CLINIC_OR_DEPARTMENT_OTHER): Payer: BC Managed Care – PPO

## 2022-11-07 ENCOUNTER — Other Ambulatory Visit (HOSPITAL_BASED_OUTPATIENT_CLINIC_OR_DEPARTMENT_OTHER): Payer: Self-pay

## 2022-11-07 ENCOUNTER — Encounter (HOSPITAL_COMMUNITY): Payer: Self-pay

## 2022-11-07 ENCOUNTER — Emergency Department (HOSPITAL_BASED_OUTPATIENT_CLINIC_OR_DEPARTMENT_OTHER): Admission: EM | Admit: 2022-11-07 | Payer: BC Managed Care – PPO | Source: Home / Self Care

## 2022-11-07 DIAGNOSIS — K529 Noninfective gastroenteritis and colitis, unspecified: Secondary | ICD-10-CM | POA: Diagnosis not present

## 2022-11-07 DIAGNOSIS — Z1152 Encounter for screening for COVID-19: Secondary | ICD-10-CM | POA: Insufficient documentation

## 2022-11-07 DIAGNOSIS — N289 Disorder of kidney and ureter, unspecified: Secondary | ICD-10-CM | POA: Insufficient documentation

## 2022-11-07 DIAGNOSIS — R112 Nausea with vomiting, unspecified: Secondary | ICD-10-CM | POA: Diagnosis present

## 2022-11-07 LAB — COMPREHENSIVE METABOLIC PANEL
ALT: 15 U/L (ref 0–44)
AST: 22 U/L (ref 15–41)
Albumin: 4.5 g/dL (ref 3.5–5.0)
Alkaline Phosphatase: 52 U/L (ref 38–126)
Anion gap: 9 (ref 5–15)
BUN: 30 mg/dL — ABNORMAL HIGH (ref 6–20)
CO2: 24 mmol/L (ref 22–32)
Calcium: 8.6 mg/dL — ABNORMAL LOW (ref 8.9–10.3)
Chloride: 103 mmol/L (ref 98–111)
Creatinine, Ser: 1.11 mg/dL (ref 0.61–1.24)
GFR, Estimated: >60 mL/min (ref >60)
Glucose, Bld: 119 mg/dL — ABNORMAL HIGH (ref 70–99)
Potassium: 3.6 mmol/L (ref 3.5–5.1)
Sodium: 136 mmol/L (ref 135–145)
Total Bilirubin: 0.7 mg/dL (ref 0.3–1.2)
Total Protein: 7.8 g/dL (ref 6.5–8.1)

## 2022-11-07 LAB — CBC
HCT: 44.7 % (ref 39.0–52.0)
Hemoglobin: 15.4 g/dL (ref 13.0–17.0)
MCH: 30.5 pg (ref 26.0–34.0)
MCHC: 34.5 g/dL (ref 30.0–36.0)
MCV: 88.5 fL (ref 80.0–100.0)
Platelets: 200 10*3/uL (ref 150–400)
RBC: 5.05 MIL/uL (ref 4.22–5.81)
RDW: 13.2 % (ref 11.5–15.5)
WBC: 4.5 10*3/uL (ref 4.0–10.5)
nRBC: 0 % (ref 0.0–0.2)

## 2022-11-07 LAB — URINALYSIS, ROUTINE W REFLEX MICROSCOPIC
Bilirubin Urine: NEGATIVE
Glucose, UA: NEGATIVE mg/dL
Hgb urine dipstick: NEGATIVE
Ketones, ur: NEGATIVE mg/dL
Leukocytes,Ua: NEGATIVE
Nitrite: NEGATIVE
Protein, ur: 30 mg/dL — AB
Specific Gravity, Urine: 1.035 — ABNORMAL HIGH (ref 1.005–1.030)
pH: 6 (ref 5.0–8.0)

## 2022-11-07 LAB — RESP PANEL BY RT-PCR (RSV, FLU A&B, COVID)  RVPGX2
Influenza A by PCR: NEGATIVE
Influenza B by PCR: NEGATIVE
Resp Syncytial Virus by PCR: NEGATIVE
SARS Coronavirus 2 by RT PCR: NEGATIVE

## 2022-11-07 MED ORDER — ONDANSETRON HCL 4 MG/2ML IJ SOLN
4.0000 mg | Freq: Once | INTRAMUSCULAR | Status: AC
  Administered 2022-11-07: 4 mg via INTRAVENOUS
  Filled 2022-11-07: qty 2

## 2022-11-07 MED ORDER — ONDANSETRON HCL 8 MG PO TABS
8.0000 mg | ORAL_TABLET | Freq: Three times a day (TID) | ORAL | 0 refills | Status: DC | PRN
  Filled 2022-11-07: qty 20, 7d supply, fill #0

## 2022-11-07 MED ORDER — IOHEXOL 300 MG/ML  SOLN
100.0000 mL | Freq: Once | INTRAMUSCULAR | Status: AC | PRN
  Administered 2022-11-07: 75 mL via INTRAVENOUS

## 2022-11-07 MED ORDER — LACTATED RINGERS IV BOLUS
1000.0000 mL | Freq: Once | INTRAVENOUS | Status: AC
  Administered 2022-11-07: 1000 mL via INTRAVENOUS

## 2022-11-07 NOTE — Discharge Instructions (Signed)
Please have Zofran filled and take as needed for nausea Began with clear liquids.  You may advance your diet as tolerated There is an abnormal finding of a lesion on your kidney.  Please

## 2022-11-07 NOTE — ED Provider Notes (Signed)
[facility] EMERGENCY DEPT Provider Note   CSN: [id number] Arrival date & time: 11/07/22  0935     History  Chief Complaint  Patient presents with   Abdominal Pain   Emesis   Diarrhea         Victor King is a 41 y.o. male.  HPI 41 year old male presents today complaining of nausea, vomiting, diarrhea throughout the day yesterday.  Had decreased p.o. intake today secondary to this.  Today he feels somewhat improved but has not attempted to drink anything.  He has not had any diarrhea today.  He denies fever or chills.  He had some abdominal discomfort yesterday.  He denies having any similar prior symptoms.     Home Medications Prior to Admission medications   Medication Sig Start Date End Date Taking? Authorizing Provider  ondansetron (ZOFRAN) 8 MG tablet Take 1 tablet (8 mg total) by mouth every 8 (eight) hours as needed for nausea or vomiting. 11/07/22  Yes Margarita Grizzle, MD      Allergies    Patient has no known allergies.    Review of Systems   Review of Systems  Physical Exam Updated Vital Signs BP 120/79 (BP Location: Left Arm)   Pulse 70   Temp 98.3 F (36.8 C) (Oral)   Resp 20   Ht 1.651 m (5\' 5" )   Wt 59 kg   SpO2 100%   BMI 21.63 kg/m  Physical Exam Vitals and nursing note reviewed.  Constitutional:      Appearance: He is well-developed.  HENT:     Head: Normocephalic.  Eyes:     Extraocular Movements: Extraocular movements intact.  Cardiovascular:     Rate and Rhythm: Normal rate.     Heart sounds: Normal heart sounds.  Pulmonary:     Effort: Pulmonary effort is normal.     Breath sounds: Normal breath sounds.  Abdominal:     General: Abdomen is flat. Bowel sounds are normal.     Palpations: Abdomen is soft.     Tenderness: There is abdominal tenderness in the right lower quadrant and suprapubic area.  Skin:    General: Skin is warm and dry.     Capillary Refill: Capillary refill takes less than 2 seconds.   Neurological:     General: No focal deficit present.     Mental Status: He is alert.  Psychiatric:        Mood and Affect: Mood normal.     ED Results / Procedures / Treatments   Labs (all labs ordered are listed, but only abnormal results are displayed) Labs Reviewed  COMPREHENSIVE METABOLIC PANEL - Abnormal; Notable for the following components:      Result Value   Glucose, Bld 119 (*)    BUN 30 (*)    Calcium 8.6 (*)    All other components within normal limits  URINALYSIS, ROUTINE W REFLEX MICROSCOPIC - Abnormal; Notable for the following components:   Specific Gravity, Urine 1.035 (*)    Protein, ur 30 (*)    All other components within normal limits  RESP PANEL BY RT-PCR (RSV, FLU A&B, COVID)  [id number]  CBC    EKG None  Radiology CT ABDOMEN PELVIS W CONTRAST  Result Date: 11/07/2022 CLINICAL DATA:  Abdominal pain with nausea, vomiting, and diarrhea for the past day. History of hernia repair. EXAM: CT ABDOMEN AND PELVIS WITH CONTRAST TECHNIQUE: Multidetector CT imaging of the abdomen and pelvis was performed using the standard protocol following bolus  administration of intravenous contrast. RADIATION DOSE REDUCTION: This exam was performed according to the departmental dose-optimization program which includes automated exposure control, adjustment of the mA and/or kV according to patient size and/or use of iterative reconstruction technique. CONTRAST:  83mL OMNIPAQUE IOHEXOL 300 MG/ML  SOLN COMPARISON:  Abdominal x-Louanne Calvillo from yesterday. FINDINGS: Lower chest: No acute abnormality. Hepatobiliary: No focal liver abnormality is seen. No gallstones, gallbladder wall thickening, or biliary dilatation. Pancreas: Unremarkable. No pancreatic ductal dilatation or surrounding inflammatory changes. Spleen: Normal in size without focal abnormality. Adrenals/Urinary Tract: Adrenal glands are unremarkable. 1.6 cm simple cyst in the left kidney. No follow-up imaging is recommended. Small 6 mm  enhancing exophytic lesion arising from the lower pole of the right kidney (series 2, image 40). No renal calculi or hydronephrosis. Bladder is unremarkable for the degree of distention. Stomach/Bowel: Stomach is within normal limits. Appendix appears normal. Multiple mildly thickened loops of small bowel in the pelvis. Mild wall thickening of the right colon with subtle pericolonic inflammatory fat stranding. No obstruction. Vascular/Lymphatic: No significant vascular findings are present. No enlarged abdominal or pelvic lymph nodes. Reproductive: Prostate is unremarkable. Other: Trace free fluid in the pelvis, likely reactive. No pneumoperitoneum. Musculoskeletal: No acute or significant osseous findings. IMPRESSION: 1. Small bowel and right colon wall thickening, suggestive of enterocolitis. No obstruction. 2. Small 6 mm enhancing exophytic lesion arising from the lower pole of the right kidney. Recommend non-emergent outpatient follow-up renal mass protocol MRI with and without contrast for further evaluation. Electronically Signed   By: Obie Dredge M.D.   On: 11/07/2022 12:34    Procedures Procedures    Medications Ordered in ED Medications  lactated ringers bolus 1,000 mL (0 mLs Intravenous Stopped 11/07/22 1204)  ondansetron (ZOFRAN) injection 4 mg (4 mg Intravenous Given 11/07/22 1058)  iohexol (OMNIPAQUE) 300 MG/ML solution 100 mL (75 mLs Intravenous Contrast Given 11/07/22 1207)    ED Course/ Medical Decision Making/ A&P Clinical Course as of 11/07/22 1246  Tue Nov 07, 2022  1241 CT reviewed interpreted no evidence of acute abnormality noted on my interpretation radiologist interpretation notes small bowel and right colon wall thickening some Discussed suggestive of enterocolitis Small 6 mm enhancing exophytic lesion arising from the lower pole of right kidney recommend nonemergent outpatient follow-up [DR]  1241 Complete metabolic panel reviewed interpreted significant for slightly  increased BUN at 30 and calcium 8.6 otherwise within normal limits CBC reviewed interpreted within normal limits [DR]  1242 Respiratory panel reviewed interpreted negative [DR]    Clinical Course User Index [DR] Margarita Grizzle, MD                           Medical Decision Making 41 year old male presents today with nausea vomiting diarrhea.  Differential diagnosis includes but is not limited to small bowel obstruction, appendicitis, cholecystitis, viral gastroenteritis on evaluation here his workup is consistent with gastroenteritis. Incidental finding of exophytic lesion in kidney.  Patient is advised regarding need for close follow-up. He is able to tolerate p.o. fluids here.   Amount and/or Complexity of Data Reviewed Labs: ordered. Radiology: ordered.  Risk Prescription drug management.         Final Clinical Impression(s) / ED Diagnoses Final diagnoses:  Gastroenteritis  Kidney lesion    Rx / DC Orders ED Discharge Orders          Ordered    ondansetron (ZOFRAN) 8 MG tablet  Every 8 hours PRN  11/07/22 1245              Margarita Grizzle, MD 11/07/22 1246

## 2022-11-07 NOTE — ED Notes (Signed)
Pt provider with ginger ale at this time for PO challenge per providers request.

## 2022-11-07 NOTE — ED Triage Notes (Signed)
Patient presents POV C/O generalized abdominal pain, N/V/D X1 day. Subjective fevers. Denies sick contacts.

## 2022-11-07 NOTE — ED Notes (Signed)
Pt was able to tolerate fluids. Provide notified.

## 2023-09-01 ENCOUNTER — Encounter (HOSPITAL_BASED_OUTPATIENT_CLINIC_OR_DEPARTMENT_OTHER): Payer: Self-pay

## 2023-09-01 ENCOUNTER — Emergency Department (HOSPITAL_BASED_OUTPATIENT_CLINIC_OR_DEPARTMENT_OTHER): Payer: PRIVATE HEALTH INSURANCE

## 2023-09-01 ENCOUNTER — Emergency Department (HOSPITAL_BASED_OUTPATIENT_CLINIC_OR_DEPARTMENT_OTHER)
Admission: EM | Admit: 2023-09-01 | Discharge: 2023-09-01 | Disposition: A | Discharge status: Home / Self Care | Payer: PRIVATE HEALTH INSURANCE | Attending: Emergency Medicine | Admitting: Emergency Medicine

## 2023-09-01 DIAGNOSIS — H05011 Cellulitis of right orbit: Secondary | ICD-10-CM | POA: Insufficient documentation

## 2023-09-01 DIAGNOSIS — L03213 Periorbital cellulitis: Secondary | ICD-10-CM

## 2023-09-01 LAB — COMPREHENSIVE METABOLIC PANEL
ALT: 21 U/L (ref 0–44)
AST: 24 U/L (ref 15–41)
Albumin: 4.3 g/dL (ref 3.5–5.0)
Alkaline Phosphatase: 57 U/L (ref 38–126)
Anion gap: 6 (ref 5–15)
BUN: 27 mg/dL — ABNORMAL HIGH (ref 6–20)
CO2: 27 mmol/L (ref 22–32)
Calcium: 9 mg/dL (ref 8.9–10.3)
Chloride: 103 mmol/L (ref 98–111)
Creatinine, Ser: 0.79 mg/dL (ref 0.61–1.24)
GFR, Estimated: >60 mL/min (ref >60)
Glucose, Bld: 85 mg/dL (ref 70–99)
Potassium: 3.9 mmol/L (ref 3.5–5.1)
Sodium: 136 mmol/L (ref 135–145)
Total Bilirubin: 0.7 mg/dL (ref 0.3–1.2)
Total Protein: 6.9 g/dL (ref 6.5–8.1)

## 2023-09-01 LAB — CBC WITH DIFFERENTIAL/PLATELET
Abs Immature Granulocytes: 0 10*3/uL (ref 0.00–0.07)
Basophils Absolute: 0 10*3/uL (ref 0.0–0.1)
Basophils Relative: 1 %
Eosinophils Absolute: 0 10*3/uL (ref 0.0–0.5)
Eosinophils Relative: 1 %
HCT: 41.5 % (ref 39.0–52.0)
Hemoglobin: 14.3 g/dL (ref 13.0–17.0)
Immature Granulocytes: 0 %
Lymphocytes Relative: 23 %
Lymphs Abs: 0.8 10*3/uL (ref 0.7–4.0)
MCH: 30.1 pg (ref 26.0–34.0)
MCHC: 34.5 g/dL (ref 30.0–36.0)
MCV: 87.4 fL (ref 80.0–100.0)
Monocytes Absolute: 0.5 10*3/uL (ref 0.1–1.0)
Monocytes Relative: 15 %
Neutro Abs: 2.2 10*3/uL (ref 1.7–7.7)
Neutrophils Relative %: 60 %
Platelets: 203 10*3/uL (ref 150–400)
RBC: 4.75 MIL/uL (ref 4.22–5.81)
RDW: 12.8 % (ref 11.5–15.5)
WBC: 3.5 10*3/uL — ABNORMAL LOW (ref 4.0–10.5)
nRBC: 0 % (ref 0.0–0.2)

## 2023-09-01 MED ORDER — FLUORESCEIN SODIUM 1 MG OP STRP
1.0000 | ORAL_STRIP | Freq: Once | OPHTHALMIC | Status: AC
  Administered 2023-09-01: 1 via OPHTHALMIC
  Filled 2023-09-01: qty 1

## 2023-09-01 MED ORDER — AMOXICILLIN 875 MG PO TABS: 875.0000 mg | ORAL_TABLET | Freq: Two times a day (BID) | ORAL | 0 refills | Status: AC

## 2023-09-01 MED ORDER — TETRACAINE HCL 0.5 % OP SOLN
1.0000 [drp] | Freq: Once | OPHTHALMIC | Status: AC
  Administered 2023-09-01: 1 [drp] via OPHTHALMIC
  Filled 2023-09-01: qty 4

## 2023-09-01 MED ORDER — SULFAMETHOXAZOLE-TRIMETHOPRIM 800-160 MG PO TABS: 1.0000 | ORAL_TABLET | Freq: Two times a day (BID) | ORAL | 0 refills | Status: AC

## 2023-09-01 MED ORDER — OXYCODONE-ACETAMINOPHEN 5-325 MG PO TABS
1.0000 | ORAL_TABLET | Freq: Once | ORAL | Status: AC
  Administered 2023-09-01: 1 via ORAL
  Filled 2023-09-01: qty 1

## 2023-09-01 NOTE — ED Provider Notes (Signed)
[place] EMERGENCY DEPARTMENT AT [facility] Provider Note   CSN: [id number] Arrival date & time: 09/01/23  0827     History Chief Complaint  Patient presents with   Eye Problem    Victor King is a 42 y.o. male.  Patient with past history significant for allergies, migraine headaches, presents to the emergency department with concerns of an eye problem.  Reports that he was splashed by bleach approximately 2 weeks ago while at work.  States at that time, he went to an urgent care and had his eye washed out with saline.  He also reports a vision improved after this as to the urgent care and has had no regression in symptoms.  However he does report that he woke up this morning concern of right eye blurriness and headache. History of migraine headaches but feels slightly worse than previous. He was recently on antibiotic drops for the splash into his eye but denies any other recent medications. Finished these drops a few days ago without sudden recurrence of symptoms.  No recent cough, congestion, runny nose, sore throat.  No history of any ocular problems.   Eye Problem Associated symptoms: photophobia        Home Medications Prior to Admission medications   Medication Sig Start Date End Date Taking? Authorizing Provider  amoxicillin (AMOXIL) 875 MG tablet Take 1 tablet (875 mg total) by mouth 2 (two) times daily for 7 days. 09/01/23 09/08/23 Yes Smitty Knudsen, PA-C  sulfamethoxazole-trimethoprim (BACTRIM DS) 800-160 MG tablet Take 1 tablet by mouth 2 (two) times daily for 7 days. 09/01/23 09/08/23 Yes Maryanna Shape A, PA-C  montelukast (SINGULAIR) 10 MG tablet Take 1 tablet (10 mg total) by mouth at bedtime. Patient not taking: Reported on 11/07/2022 05/30/21   Early, Sung Amabile, NP  ondansetron (ZOFRAN) 8 MG tablet Take 1 tablet (8 mg total) by mouth every 8 (eight) hours as needed for nausea or vomiting. 11/07/22   Margarita Grizzle, MD  pantoprazole (PROTONIX) 40 MG tablet  Take 1 tablet (40 mg total) by mouth daily. Patient not taking: Reported on 11/07/2022 05/30/21   Early, Sung Amabile, NP      Allergies    Patient has no known allergies.    Review of Systems   Review of Systems  Eyes:  Positive for photophobia and visual disturbance.  All other systems reviewed and are negative.   Physical Exam Updated Vital Signs BP (!) 132/107   Pulse 64   Temp 97.8 F (36.6 C) (Oral)   Resp 18   Wt 68.9 kg   SpO2 97%   BMI 25.29 kg/m  Physical Exam Vitals and nursing note reviewed.  Constitutional:      General: He is not in acute distress.    Appearance: He is well-developed.  HENT:     Head: Normocephalic and atraumatic.  Eyes:     General: No scleral icterus.       Right eye: Discharge present.        Left eye: No discharge.     Intraocular pressure: Measurements were taken using a handheld tonometer.    Extraocular Movements: Extraocular movements intact.     Left eye: Left eye abnormal extraocular motion: 20.     Conjunctiva/sclera: Conjunctivae normal.     Pupils: Pupils are equal, round, and reactive to light.     Comments: Preserved EOM movement with minimal discomfort. Some notable photophobia improved with diming lights.  Cardiovascular:     Rate and  Rhythm: Normal rate and regular rhythm.     Heart sounds: No murmur heard. Pulmonary:     Effort: Pulmonary effort is normal. No respiratory distress.     Breath sounds: Normal breath sounds.  Abdominal:     Palpations: Abdomen is soft.     Tenderness: There is no abdominal tenderness.  Musculoskeletal:        General: No swelling.     Cervical back: Neck supple.  Skin:    General: Skin is warm and dry.     Capillary Refill: Capillary refill takes less than 2 seconds.  Neurological:     Mental Status: He is alert.  Psychiatric:        Mood and Affect: Mood normal.     ED Results / Procedures / Treatments   Labs (all labs ordered are listed, but only abnormal results are  displayed) Labs Reviewed  CBC WITH DIFFERENTIAL/PLATELET - Abnormal; Notable for the following components:      Result Value   WBC 3.5 (*)    All other components within normal limits  COMPREHENSIVE METABOLIC PANEL - Abnormal; Notable for the following components:   BUN 27 (*)    All other components within normal limits    EKG None  Radiology CT Head Wo Contrast  Result Date: 09/01/2023 CLINICAL DATA:  42 year old male status post bleach to right eye 2 weeks ago, eye wash at that time. Blurred vision upon waking today. EXAM: CT HEAD WITHOUT CONTRAST TECHNIQUE: Contiguous axial images were obtained from the base of the skull through the vertex without intravenous contrast. RADIATION DOSE REDUCTION: This exam was performed according to the departmental dose-optimization program which includes automated exposure control, adjustment of the mA and/or kV according to patient size and/or use of iterative reconstruction technique. COMPARISON:  Head CT 11-26-2022. FINDINGS: Brain: Pronounced new bilateral lateral ventricular enlargement when compared to the CT in December, at which time the lateral ventricles were diminutive. However, the 3rd and 4th ventricles remain diminutive. No transependymal edema. No superimposed midline shift, ventriculomegaly, mass effect, evidence of mass lesion, intracranial hemorrhage or evidence of cortically based acute infarction. Gray-white differentiation remains symmetric. But there seems to be symmetric bilateral deep gray and deep white matter volume loss in the hemispheres compared to last year. Vascular: A left transverse sinus arachnoid granulation on coronal image 14 is larger since last year, significance doubtful. SIRT hyperdensity Skull: No fracture identified. Sinuses/Orbits: Visualized paranasal sinuses and mastoids are clear. Other: Right side preseptal orbit soft tissue thickening, swelling series 3, image 10. Right globe appears intact. No postseptal soft  tissue swelling or inflammation identified. Contralateral left orbits soft tissues appear negative. Visualized scalp soft tissues are within normal limits. IMPRESSION: 1. Right preseptal Orbit soft tissue swelling compatible with preseptal cellulitis or soft tissue injury. No postseptal abnormality or fracture identified. 2. Substantial change in the lateral ventricle size since December of 2023. This seems to be ex vacuo related. No other acute intracranial abnormality. Electronically Signed   By: Odessa Fleming M.D.   On: 09/01/2023 10:57    Procedures Procedures    Medications Ordered in ED Medications  fluorescein ophthalmic strip 1 strip (1 strip Right Eye Given by Other 09/01/23 0918)  tetracaine (PONTOCAINE) 0.5 % ophthalmic solution 1 drop (1 drop Right Eye Given by Other 09/01/23 0918)  oxyCODONE-acetaminophen (PERCOCET/ROXICET) 5-325 MG per tablet 1 tablet (1 tablet Oral Given 09/01/23 1002)    ED Course/ Medical Decision Making/ A&P  Medical Decision Making Amount and/or Complexity of Data Reviewed Labs: ordered. Radiology: ordered.  Risk Prescription drug management.   This patient presents to the ED for concern of eye problem.  Differential diagnosis includes periorbital cellulitis, stroke, retinal detachment, central retinal artery occlusion, vitreous hemorrhage    Lab Tests:  I Ordered, and personally interpreted labs.  The pertinent results include: CBC with mild leukopenia 3.5 otherwise unremarkable, CMP with slight elevation of BUN at 27 but otherwise unremarkable   Imaging Studies ordered:  I ordered imaging studies including CT head I independently visualized and interpreted imaging which showed no acute abnormal findings in the intracranial space however some evidence of soft tissue swelling in the periorbital region of the right eye consistent with preseptal cellulitis I agree with the radiologist interpretation   Medicines  ordered and prescription drug management:  I ordered medication including tetracaine, Percocet for ophthalmic anesthetic, pain Reevaluation of the patient after these medicines showed that the patient improved I have reviewed the patients home medicines and have made adjustments as needed   Problem List / ED Course:  Patient presents to the emergency department concerns of eye discomfort.  States that he recently had exposure to bleach splashed into his eye about 2 weeks ago.  Since then, at improvement without any acute vision changes.  Was on ophthalmic antibiotic drops in the interim and has not since (antibiotics without some recurrence in symptoms.  Reports he woke up this morning feeling that he was having some increasing eye discomfort and I am blurring of the right eye.  Not currently any blood thinners.  No history of stroke. On examination, patient's right eye slightly erythematous with conjunctival injection and some erythema surrounding the upper and lower eyelids.  No obvious proptosis or chemosis noted.  On examination, patient has vision largely at baseline appears to have trouble though with bright light exposure causing photophobia.  Does have a history of migraine headaches but I believe that patient's eye discomfort with squinting is causing difficulty assessing properly his vision.  Fluorescein staining is thankfully unremarkable with no signs of dendritic findings concerning for shingles.  No acute findings to suggest abrasion to the eye either.  Will proceed with labs refused to be due to rule out any more concerning causes of symptoms.  Dose of Percocet given for pain control. Lab workup is unremarkable.  CT imaging shows some swelling in the periorbital region of the right eye consistent with preseptal cellulitis.  Given the patient is having notable eye discomfort on the site with some redness of the skin surrounding, will initiate treatment with antibiotic therapy.  Did advise  patient that he would benefit from outpatient ophthalmology follow-up within the next 1 to 2 days but given that it is currently the weekend, patient should reach out to ophthalmology on Monday morning.  Patient is agreeable with this current plan and verbalized understanding strict return precautions.  All questions answered prior to patient discharge.  Patient discharged home in stable condition.  Final Clinical Impression(s) / ED Diagnoses Final diagnoses:  Periorbital cellulitis of right eye    Rx / DC Orders ED Discharge Orders          Ordered    sulfamethoxazole-trimethoprim (BACTRIM DS) 800-160 MG tablet  2 times daily        09/01/23 1139    amoxicillin (AMOXIL) 875 MG tablet  2 times daily        09/01/23 1139  Smitty Knudsen, PA-C 09/01/23 1150    Laurence Spates, MD 09/02/23 [phone]

## 2023-09-01 NOTE — Discharge Instructions (Addendum)
You are seen in the emergency department today with eye concerns.  Based on examination, there is not appear to be any abrasions on the eye itself and CT imaging appears to show signs of periorbital cellulitis which is likely causing some irritation of noted to the right eye.  I have sent a prescription for 2 antibiotics to her pharmacy for you to take for the next week.  Please take this prescribed.  If you have any worsening symptoms return the emergency department.

## 2023-09-01 NOTE — ED Triage Notes (Signed)
He tells me his right eye was splashed with bleach at work ~ 2 weeks ago. He tells me that at that time he was seen at [facility] and "they washed my eye out with 2 liters of saline". He states his vision subsequently improved and was essentially normal. He is here d/t upon awakening today he found his right eye to have "blurry vision".

## 2023-09-03 ENCOUNTER — Other Ambulatory Visit: Payer: Self-pay

## 2023-09-03 ENCOUNTER — Other Ambulatory Visit (HOSPITAL_BASED_OUTPATIENT_CLINIC_OR_DEPARTMENT_OTHER): Payer: Self-pay

## 2023-09-03 ENCOUNTER — Emergency Department (HOSPITAL_BASED_OUTPATIENT_CLINIC_OR_DEPARTMENT_OTHER)
Admission: EM | Admit: 2023-09-03 | Discharge: 2023-09-03 | Disposition: A | Discharge status: Home / Self Care | Payer: Self-pay | Attending: Emergency Medicine | Admitting: Emergency Medicine

## 2023-09-03 ENCOUNTER — Encounter (HOSPITAL_BASED_OUTPATIENT_CLINIC_OR_DEPARTMENT_OTHER): Payer: Self-pay

## 2023-09-03 DIAGNOSIS — B0052 Herpesviral keratitis: Secondary | ICD-10-CM | POA: Insufficient documentation

## 2023-09-03 DIAGNOSIS — B0233 Zoster keratitis: Secondary | ICD-10-CM

## 2023-09-03 MED ORDER — PROPARACAINE HCL 0.5 % OP SOLN
1.0000 [drp] | Freq: Once | OPHTHALMIC | Status: AC
  Administered 2023-09-03: 1 [drp] via OPHTHALMIC
  Filled 2023-09-03: qty 15

## 2023-09-03 MED ORDER — NAPROXEN 500 MG PO TABS
500.0000 mg | ORAL_TABLET | Freq: Two times a day (BID) | ORAL | 0 refills | Status: AC
Start: 2023-09-03 — End: ?
  Filled 2023-09-03: qty 20, 10d supply, fill #0

## 2023-09-03 MED ORDER — OXYCODONE-ACETAMINOPHEN 5-325 MG PO TABS
1.0000 | ORAL_TABLET | Freq: Once | ORAL | Status: AC
  Administered 2023-09-03: 1 via ORAL
  Filled 2023-09-03: qty 1

## 2023-09-03 MED ORDER — OXYCODONE-ACETAMINOPHEN 5-325 MG PO TABS
1.0000 | ORAL_TABLET | Freq: Four times a day (QID) | ORAL | 0 refills | Status: AC | PRN
Start: 2023-09-03 — End: ?
  Filled 2023-09-03: qty 10, 3d supply, fill #0

## 2023-09-03 MED ORDER — VALACYCLOVIR HCL 1 G PO TABS
1000.0000 mg | ORAL_TABLET | Freq: Three times a day (TID) | ORAL | 0 refills | Status: AC
Start: 2023-09-03 — End: ?
  Filled 2023-09-03 (×2): qty 21, 7d supply, fill #0

## 2023-09-03 MED ORDER — FLUORESCEIN SODIUM 1 MG OP STRP
1.0000 | ORAL_STRIP | Freq: Once | OPHTHALMIC | Status: AC
  Administered 2023-09-03: 1 via OPHTHALMIC
  Filled 2023-09-03: qty 1

## 2023-09-03 MED ORDER — VALACYCLOVIR HCL 500 MG PO TABS
1000.0000 mg | ORAL_TABLET | Freq: Once | ORAL | Status: AC
  Administered 2023-09-03: 1000 mg via ORAL
  Filled 2023-09-03: qty 2

## 2023-09-03 NOTE — Discharge Instructions (Signed)
Please read and follow all provided instructions.  Your diagnoses today include:  1. Herpes zoster keratitis    Tests performed today include: Vital signs. See below for your results today.   Medications prescribed:  Valtrex: Antiviral medication for shingles  Naproxen - anti-inflammatory pain medication Do not exceed 500mg  naproxen every 12 hours, take with food  You have been prescribed an anti-inflammatory medication or NSAID. Take with food. Take smallest effective dose for the shortest duration needed for your pain. Stop taking if you experience stomach pain or vomiting.   Percocet (oxycodone/acetaminophen) - narcotic pain medication  DO NOT drive or perform any activities that require you to be awake and alert because this medicine can make you drowsy. BE VERY CAREFUL not to take multiple medicines containing Tylenol (also called acetaminophen). Doing so can lead to an overdose which can damage your liver and cause liver failure and possibly death.  Take any prescribed medications only as directed.  Home care instructions:  Follow any educational materials contained in this packet.  BE VERY CAREFUL not to take multiple medicines containing Tylenol (also called acetaminophen). Doing so can lead to an overdose which can damage your liver and cause liver failure and possibly death.   Follow-up instructions: Please go directly to see Dr. Allena Katz today to have your eye evaluated.  You will also need to be seen by your primary care doctor in the next week to evaluate how well your symptoms are improving.  Return instructions:  Please return to the Emergency Department if you experience worsening symptoms.  Please return if you have any other emergent concerns.  Additional Information:  Your vital signs today were: BP (!) 154/106 (BP Location: Right Arm)   Pulse 84   Temp 98.1 F (36.7 C) (Oral)   Resp 17   Ht 5\' 3"  (1.6 m)   Wt 68.9 kg   SpO2 100%   BMI 26.93 kg/m  If  your blood pressure (BP) was elevated above 135/85 this visit, please have this repeated by your doctor within one month. --------------

## 2023-09-03 NOTE — ED Notes (Signed)
Pt understood no drinking/driving due to the meds that were given... Going to take a uber to his MD appointment.Marland KitchenMarland Kitchen

## 2023-09-03 NOTE — ED Provider Notes (Signed)
[place] EMERGENCY DEPARTMENT AT [facility] Provider Note   CSN: [id number] Arrival date & time: 09/03/23  0820     History  Chief Complaint  Patient presents with   Facial Pain    Victor King is a 42 y.o. male.  Patient presents today for evaluation of right-sided facial pain.  Patient had an injury a couple of weeks ago where he got bleach into his eye.  This was irrigated, treated with antibiotic drops, symptoms returned to normal.  Patient was seen in the emergency department for eye pain starting about 2 days ago.  He had CT done as well as lab workup.  He was diagnosed with periorbital cellulitis and treated with amoxicillin and Bactrim.  His symptoms have progressed over the past 2 days since his visit.  He has developed rash, redness and vesicles extending from the upper eyelid, forehead, scalp, on the right side.  This is consistent with shingles.  He has difficulty opening his eye now due to burning.  He reports that his vision looks hazy.       Home Medications Prior to Admission medications   Medication Sig Start Date End Date Taking? Authorizing Provider  amoxicillin (AMOXIL) 875 MG tablet Take 1 tablet (875 mg total) by mouth 2 (two) times daily for 7 days. 09/01/23 09/08/23  Smitty Knudsen, PA-C  montelukast (SINGULAIR) 10 MG tablet Take 1 tablet (10 mg total) by mouth at bedtime. Patient not taking: Reported on 11/07/2022 05/30/21   Early, Sung Amabile, NP  ondansetron (ZOFRAN) 8 MG tablet Take 1 tablet (8 mg total) by mouth every 8 (eight) hours as needed for nausea or vomiting. 11/07/22   Margarita Grizzle, MD  pantoprazole (PROTONIX) 40 MG tablet Take 1 tablet (40 mg total) by mouth daily. Patient not taking: Reported on 11/07/2022 05/30/21   Early, Sung Amabile, NP  sulfamethoxazole-trimethoprim (BACTRIM DS) 800-160 MG tablet Take 1 tablet by mouth 2 (two) times daily for 7 days. 09/01/23 09/08/23  Smitty Knudsen, PA-C      Allergies    Patient has no known  allergies.    Review of Systems   Review of Systems  Physical Exam Updated Vital Signs BP (!) 154/106 (BP Location: Right Arm)   Pulse 84   Temp 98.1 F (36.7 C) (Oral)   Resp 17   Ht 5\' 3"  (1.6 m)   Wt 68.9 kg   SpO2 100%   BMI 26.93 kg/m  Physical Exam Vitals and nursing note reviewed.  Constitutional:      Appearance: He is well-developed.  HENT:     Head: Normocephalic and atraumatic.  Eyes:     Extraocular Movements: Extraocular movements intact.     Conjunctiva/sclera:     Right eye: Right conjunctiva is injected. No chemosis, exudate or hemorrhage.    Left eye: Left conjunctiva is not injected. No chemosis, exudate or hemorrhage.    Pupils: Pupils are equal.     Right eye: Pupil is round, reactive and not sluggish. Fluorescein uptake present.     Left eye: Pupil is round, reactive and not sluggish. No fluorescein uptake.     Comments: Patient is able to read my fingers held at about 14 inches from his face, states that it seems "hazy". A couple of areas of spindle shaped areas of uptake noted on fluorescein exam. Patient tolerated but with a lot of discomfort.   Pulmonary:     Effort: No respiratory distress.  Musculoskeletal:  Cervical back: Normal range of motion and neck supple.  Skin:    General: Skin is warm and dry.     Comments: Patient with herpes zoster rash noted right periorbital, right forehead, right scalp, medial superior nose.  Eyelid edema noted.  Vesicles noted.  Neurological:     Mental Status: He is alert.     ED Results / Procedures / Treatments   Labs (all labs ordered are listed, but only abnormal results are displayed) Labs Reviewed - No data to display  EKG None  Radiology CT Head Wo Contrast  Result Date: 09/01/2023 CLINICAL DATA:  42 year old male status post bleach to right eye 2 weeks ago, eye wash at that time. Blurred vision upon waking today. EXAM: CT HEAD WITHOUT CONTRAST TECHNIQUE: Contiguous axial images were obtained  from the base of the skull through the vertex without intravenous contrast. RADIATION DOSE REDUCTION: This exam was performed according to the departmental dose-optimization program which includes automated exposure control, adjustment of the mA and/or kV according to patient size and/or use of iterative reconstruction technique. COMPARISON:  Head CT 12-04-2022. FINDINGS: Brain: Pronounced new bilateral lateral ventricular enlargement when compared to the CT in December, at which time the lateral ventricles were diminutive. However, the 3rd and 4th ventricles remain diminutive. No transependymal edema. No superimposed midline shift, ventriculomegaly, mass effect, evidence of mass lesion, intracranial hemorrhage or evidence of cortically based acute infarction. Gray-white differentiation remains symmetric. But there seems to be symmetric bilateral deep gray and deep white matter volume loss in the hemispheres compared to last year. Vascular: A left transverse sinus arachnoid granulation on coronal image 14 is larger since last year, significance doubtful. SIRT hyperdensity Skull: No fracture identified. Sinuses/Orbits: Visualized paranasal sinuses and mastoids are clear. Other: Right side preseptal orbit soft tissue thickening, swelling series 3, image 10. Right globe appears intact. No postseptal soft tissue swelling or inflammation identified. Contralateral left orbits soft tissues appear negative. Visualized scalp soft tissues are within normal limits. IMPRESSION: 1. Right preseptal Orbit soft tissue swelling compatible with preseptal cellulitis or soft tissue injury. No postseptal abnormality or fracture identified. 2. Substantial change in the lateral ventricle size since December of 2023. This seems to be ex vacuo related. No other acute intracranial abnormality. Electronically Signed   By: Odessa Fleming M.D.   On: 09/01/2023 10:57    Procedures Procedures    Medications Ordered in ED Medications   proparacaine (ALCAINE) 0.5 % ophthalmic solution 1 drop (has no administration in time range)  fluorescein ophthalmic strip 1 strip (has no administration in time range)  oxyCODONE-acetaminophen (PERCOCET/ROXICET) 5-325 MG per tablet 1 tablet (1 tablet Oral Given 09/03/23 0936)  valACYclovir (VALTREX) tablet 1,000 mg (1,000 mg Oral Given 09/03/23 0865)    ED Course/ Medical Decision Making/ A&P    Patient seen and examined. History obtained directly from patient.  I also reviewed previous ED note.  Initially, workup was concerning for periorbital cellulitis, however with new extensive rash, patient has declared himself as having herpes zoster ophthalmicus.  Will work to control pain, initiate antivirals.  I will attempt to evaluate the globe once pain is better controlled.  Patient is in a lot of discomfort.  Labs/EKG: None ordered  Imaging: None ordered  Medications/Fluids: Ordered: P.o. Percocet, p.o. Valtrex.   Most recent vital signs reviewed and are as follows: BP (!) 154/106 (BP Location: Right Arm)   Pulse 84   Temp 98.1 F (36.7 C) (Oral)   Resp 17   Ht  5\' 3"  (1.6 m)   Wt 68.9 kg   SpO2 100%   BMI 26.93 kg/m   Initial impression: Herpes zoster ophthalmicus  10:17 AM Reassessment performed.  Proparacaine drops applied and bedside Woods lamp exam and performed.  Patient appears stable.  Labs: None ordered  Imaging: None ordered  Results reviewed with patient at bedside. Questions answered.   Most current vital signs reviewed and are as follows: BP (!) 154/106 (BP Location: Right Arm)   Pulse 84   Temp 98.1 F (36.7 C) (Oral)   Resp 17   Ht 5\' 3"  (1.6 m)   Wt 68.9 kg   SpO2 100%   BMI 26.93 kg/m   Plan: I have spoken with Dr. Allena Katz of ophthalmology.  We discussed case.  He agrees with Valtrex.  He would like to see the patient in the office and for him to come to the office at 11:45 AM today.  I discussed this with patient.  He plans to take  [place].  Prescriptions written for: Valtrex, Percocet, Naprosyn  Patient counseled on use of narcotic pain medications. Counseled not to combine these medications with others containing tylenol. Urged not to drink alcohol, drive, or perform any other activities that requires focus while taking these medications. The patient verbalizes understanding and agrees with the plan.  Other home care instructions discussed: Medication treatment  ED return instructions discussed: Uncontrolled pain, vision loss  Follow-up instructions discussed: Encourage PCP follow-up later in the week.  Encouraged him to go to ophthalmology today.  He seems understand importance of this.                                 Medical Decision Making Risk Prescription drug management.   Patient with developing herpes zoster ophthalmicus with suspected keratitis.  He is in a lot of pain today.  This was treated and managed.  Woods lamp exam performed, concern for associated keratitis.  As this can be a sight-threatening condition, I contacted ophthalmology by telephone for further recommendations.  They agree with antiviral.  They like to see the patient in the office today.  Patient we discharged from the ED so that he can go follow-up later this morning.  No foreign bodies noted. No signs of glaucoma. No symptoms of retinal detachment. No ophthalmologic emergency suspected.        Final Clinical Impression(s) / ED Diagnoses Final diagnoses:  Herpes zoster keratitis    Rx / DC Orders ED Discharge Orders          Ordered    valACYclovir (VALTREX) 1000 MG tablet  3 times daily        09/03/23 1001    oxyCODONE-acetaminophen (PERCOCET/ROXICET) 5-325 MG tablet  Every 6 hours PRN        09/03/23 1003    naproxen (NAPROSYN) 500 MG tablet  2 times daily        09/03/23 1003              Renne Crigler, PA-C 09/03/23 1023    Ernie Avena, MD 09/03/23 1250

## 2023-09-03 NOTE — ED Triage Notes (Signed)
In for eval of pain to face and head. Swelling noted around right eye and right face. Reports she was splashed with bleach at work over 2 weeks ago. RX given at last ED visit but her reports it is not helping.

## 2023-09-03 NOTE — ED Notes (Signed)
Discharge paperwork given and verbally understood. 

## 2023-09-17 ENCOUNTER — Other Ambulatory Visit (HOSPITAL_BASED_OUTPATIENT_CLINIC_OR_DEPARTMENT_OTHER): Payer: Self-pay

## 2023-10-01 IMAGING — CR DG CHEST 2V
2 series · 2 of 2 positions shown · non-contrast
Comparison: March 14, 2021

CLINICAL DATA: Left chest pain.

EXAM:
CHEST - 2 VIEW

[w chest pa]
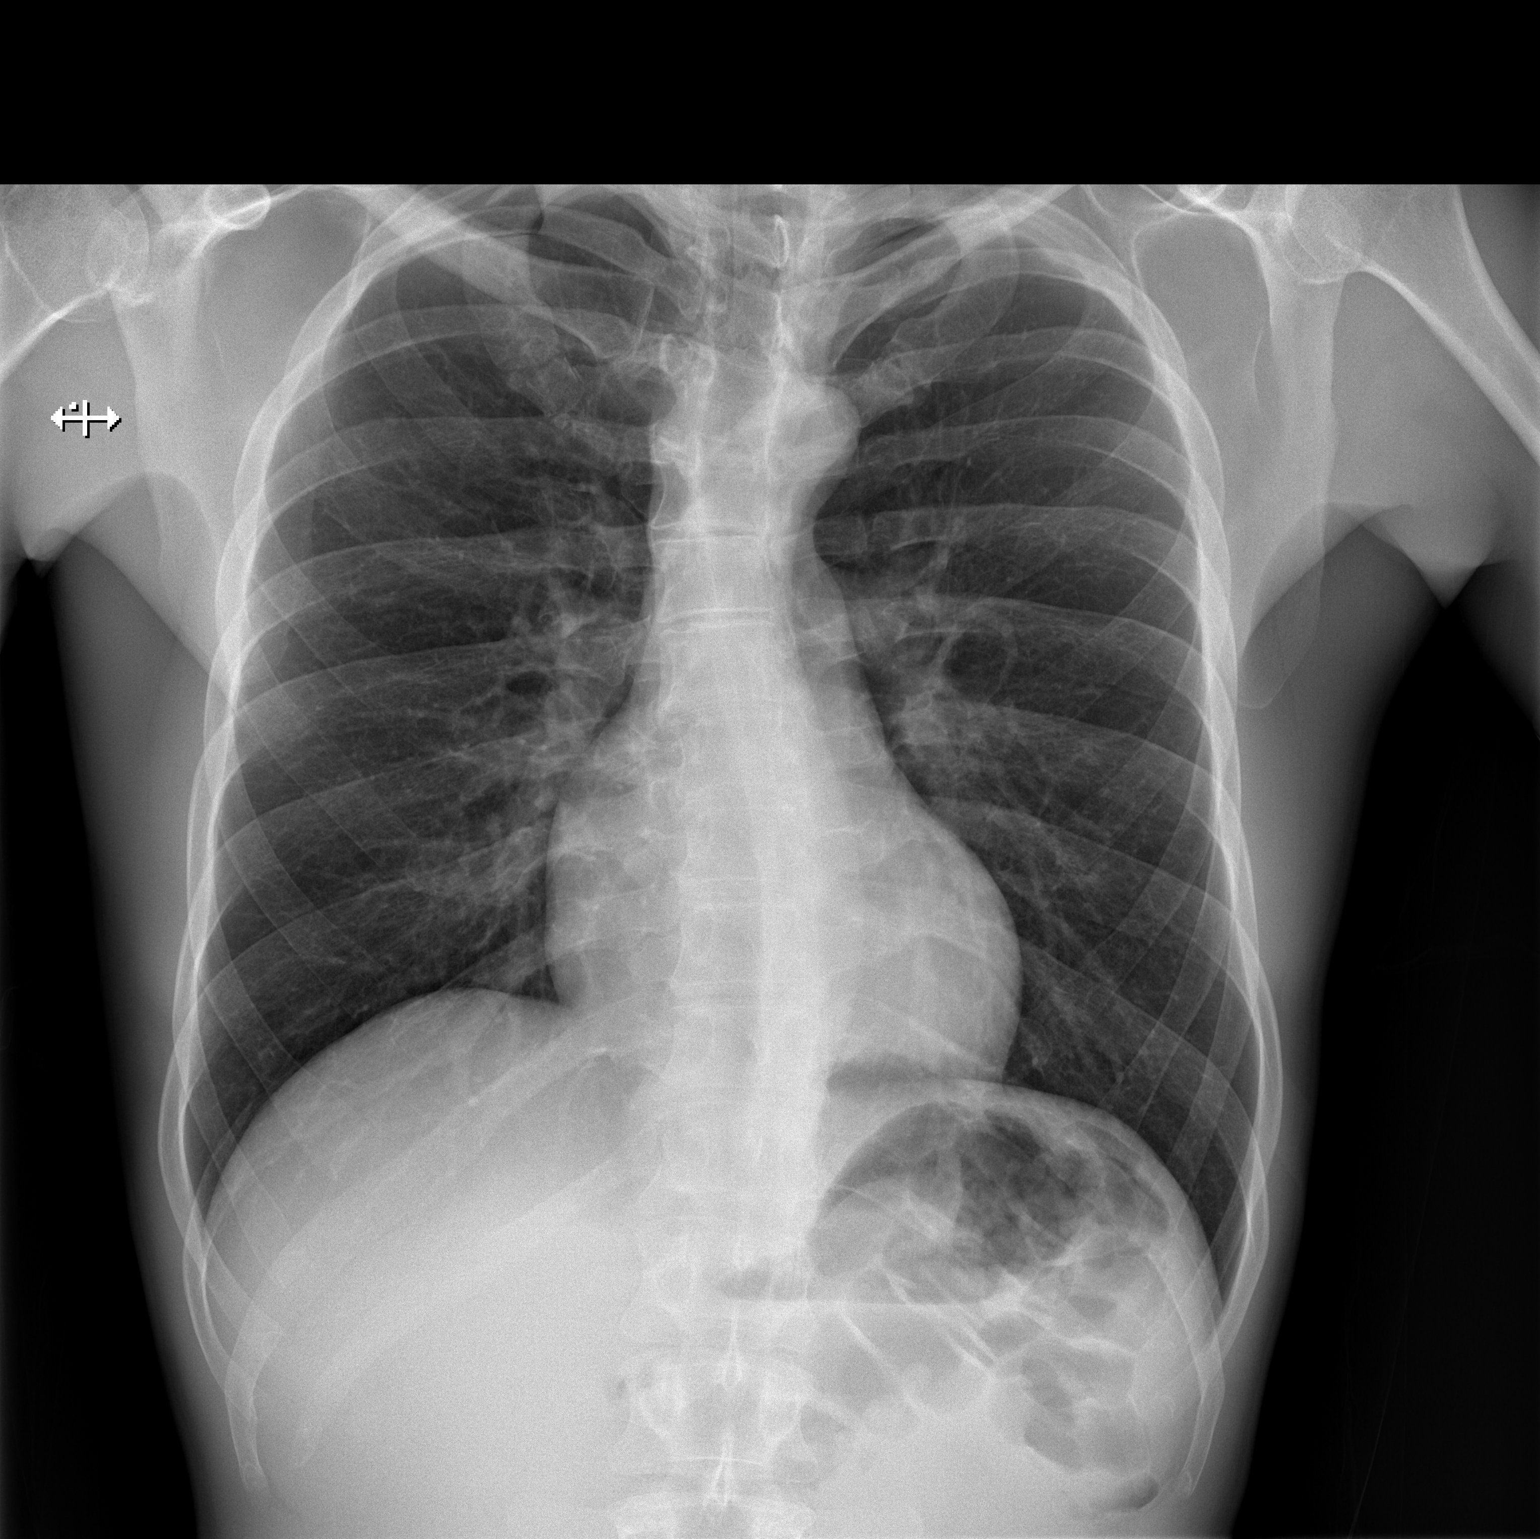

[w chest lat]
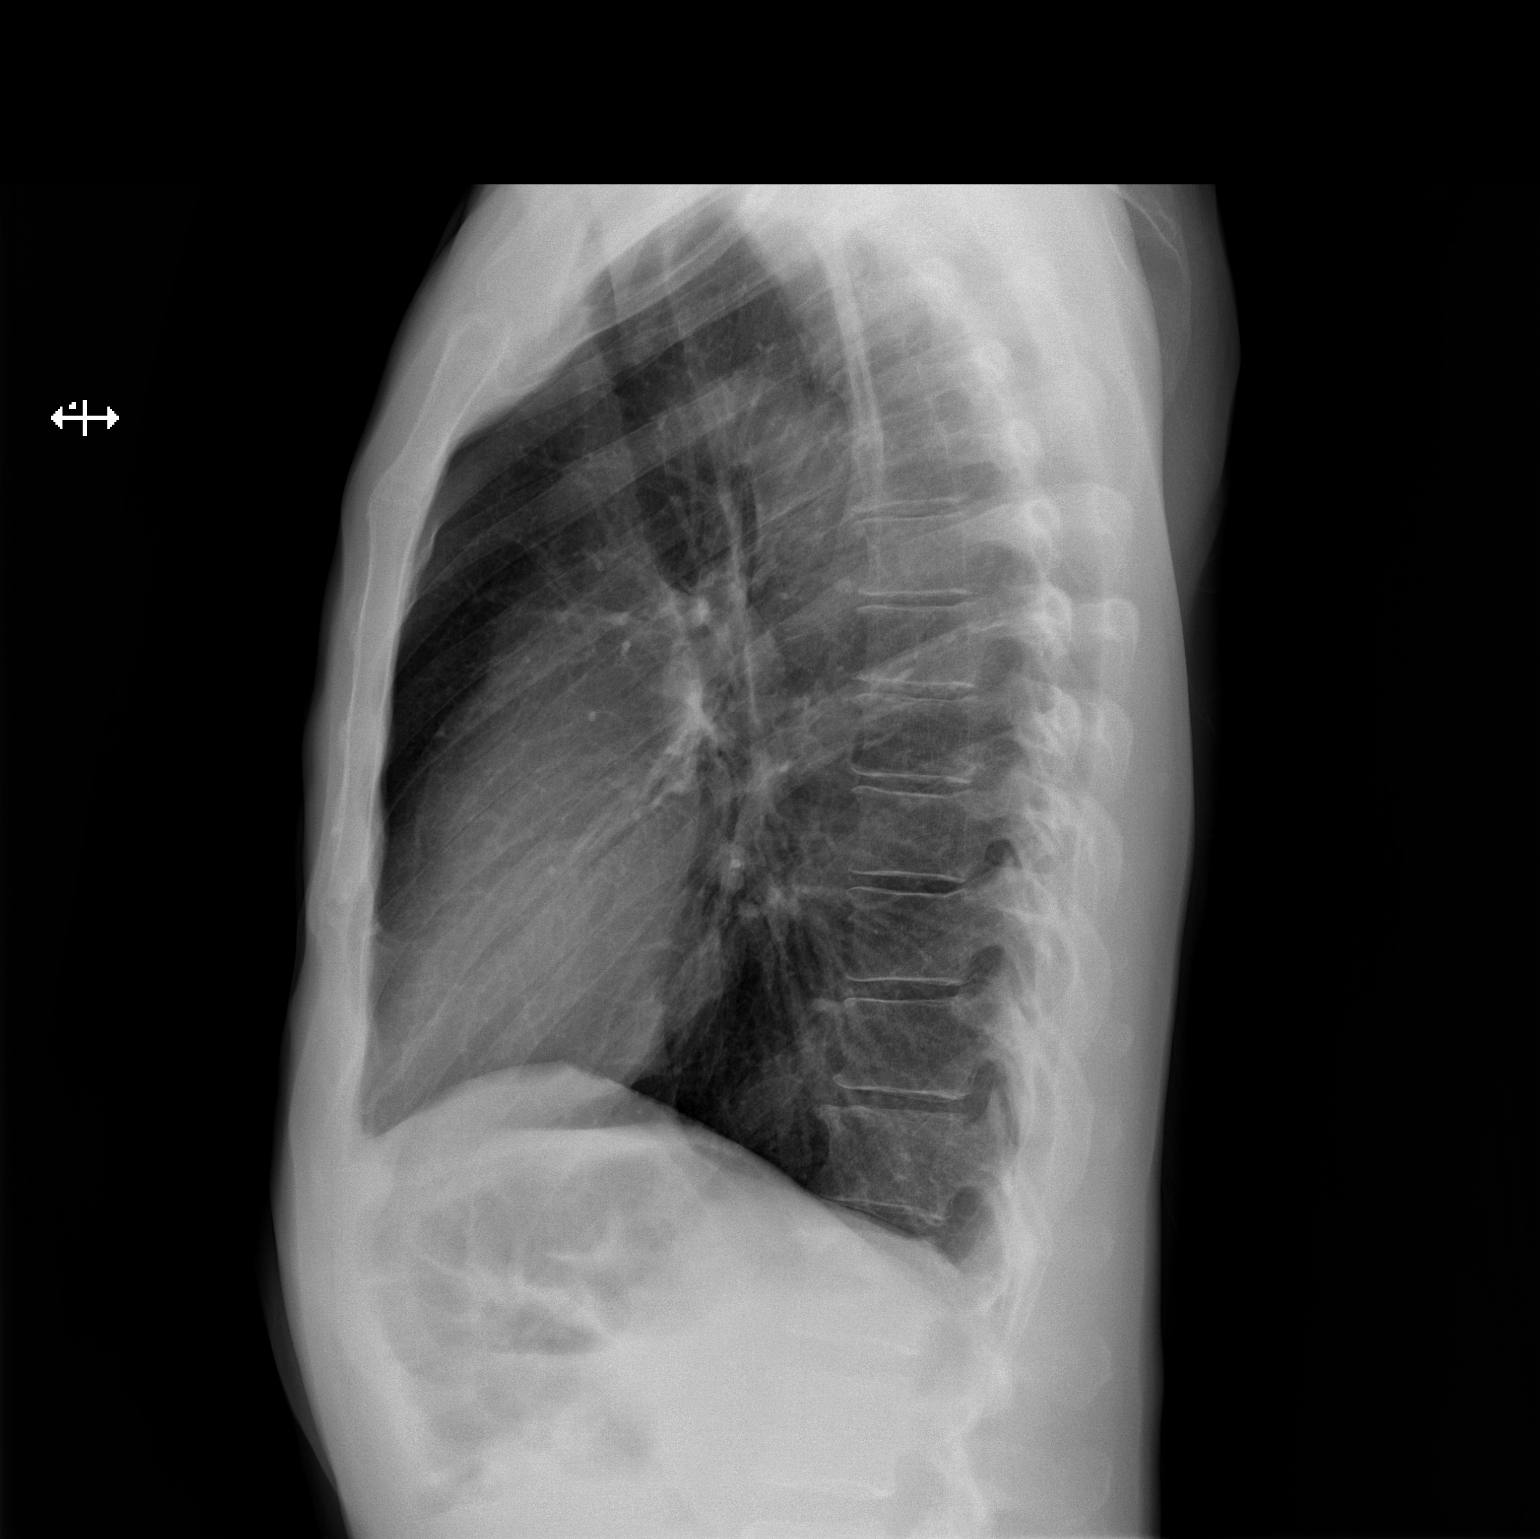

[2 of 2 positions shown; findings below may reference images not displayed]

FINDINGS: The heart size and mediastinal contours are within normal limits.
Both lungs are clear. Mild levoscoliosis of the mid to lower
thoracic spine is seen.
IMPRESSION: No active cardiopulmonary disease.

## 2024-01-14 ENCOUNTER — Emergency Department (HOSPITAL_BASED_OUTPATIENT_CLINIC_OR_DEPARTMENT_OTHER)
Admission: EM | Admit: 2024-01-14 | Discharge: 2024-01-14 | Disposition: A | Discharge status: Home / Self Care | Payer: BC Managed Care – PPO | Attending: Emergency Medicine | Admitting: Emergency Medicine

## 2024-01-14 ENCOUNTER — Encounter (HOSPITAL_BASED_OUTPATIENT_CLINIC_OR_DEPARTMENT_OTHER): Payer: Self-pay | Admitting: Emergency Medicine

## 2024-01-14 ENCOUNTER — Telehealth (HOSPITAL_BASED_OUTPATIENT_CLINIC_OR_DEPARTMENT_OTHER): Payer: Self-pay | Admitting: Emergency Medicine

## 2024-01-14 ENCOUNTER — Other Ambulatory Visit: Payer: Self-pay

## 2024-01-14 ENCOUNTER — Emergency Department (HOSPITAL_BASED_OUTPATIENT_CLINIC_OR_DEPARTMENT_OTHER): Payer: BC Managed Care – PPO | Admitting: Radiology

## 2024-01-14 DIAGNOSIS — J101 Influenza due to other identified influenza virus with other respiratory manifestations: Secondary | ICD-10-CM | POA: Diagnosis not present

## 2024-01-14 DIAGNOSIS — R059 Cough, unspecified: Secondary | ICD-10-CM | POA: Diagnosis present

## 2024-01-14 LAB — RESP PANEL BY RT-PCR (RSV, FLU A&B, COVID)  RVPGX2
Influenza A by PCR: POSITIVE — AB
Influenza B by PCR: NEGATIVE
Resp Syncytial Virus by PCR: NEGATIVE
SARS Coronavirus 2 by RT PCR: NEGATIVE

## 2024-01-14 MED ORDER — HYDROCOD POLI-CHLORPHE POLI ER 10-8 MG/5ML PO SUER: 5.0000 mL | Freq: Two times a day (BID) | ORAL | 0 refills | Status: DC | PRN

## 2024-01-14 MED ORDER — HYDROCOD POLI-CHLORPHE POLI ER 10-8 MG/5ML PO SUER: 5.0000 mL | Freq: Two times a day (BID) | ORAL | 0 refills | Status: AC | PRN

## 2024-01-14 NOTE — ED Provider Notes (Signed)
 [place] EMERGENCY DEPARTMENT AT [facility] Provider Note   CSN: [id number] Arrival date & time: 01/14/24  7829     History  Chief Complaint  Patient presents with   Cough    Victor King is a 43 y.o. male.  Pt is a 43 yo male with no significant pmhx.  Pt said he's had a cough for a week and it's not getting better.  He thinks he's had a fever.  He's been taking Vick's cough medication, but it has not helped.  He does not smoke.  No known sick contacts.       Home Medications Prior to Admission medications   Medication Sig Start Date End Date Taking? Authorizing Provider  chlorpheniramine-HYDROcodone (TUSSIONEX) 10-8 MG/5ML Take 5 mLs by mouth every 12 (twelve) hours as needed for cough. 01/14/24  Yes Jacalyn Lefevre, MD  montelukast (SINGULAIR) 10 MG tablet Take 1 tablet (10 mg total) by mouth at bedtime. Patient not taking: Reported on 11/07/2022 05/30/21   Early, Sung Amabile, NP  naproxen (NAPROSYN) 500 MG tablet Take 1 tablet (500 mg total) by mouth 2 (two) times daily. 09/03/23   Renne Crigler, PA-C  oxyCODONE-acetaminophen (PERCOCET/ROXICET) 5-325 MG tablet Take 1 tablet by mouth every 6 (six) hours as needed for severe pain. 09/03/23   Renne Crigler, PA-C  pantoprazole (PROTONIX) 40 MG tablet Take 1 tablet (40 mg total) by mouth daily. Patient not taking: Reported on 11/07/2022 05/30/21   Early, Sung Amabile, NP  valACYclovir (VALTREX) 1000 MG tablet Take 1 tablet (1,000 mg total) by mouth 3 (three) times daily. 09/03/23   Renne Crigler, PA-C      Allergies    Patient has no known allergies.    Review of Systems   Review of Systems  Respiratory:  Positive for cough.   All other systems reviewed and are negative.   Physical Exam Updated Vital Signs BP (!) 129/94   Pulse 72   Temp 98.5 F (36.9 C)   Resp 20   SpO2 99%  Physical Exam Vitals and nursing note reviewed.  Constitutional:      Appearance: Normal appearance.  HENT:     Head: Normocephalic  and atraumatic.     Right Ear: External ear normal.     Left Ear: External ear normal.     Nose: Nose normal.     Mouth/Throat:     Mouth: Mucous membranes are moist.     Pharynx: Oropharynx is clear.  Eyes:     Extraocular Movements: Extraocular movements intact.     Conjunctiva/sclera: Conjunctivae normal.     Pupils: Pupils are equal, round, and reactive to light.  Cardiovascular:     Rate and Rhythm: Normal rate and regular rhythm.     Pulses: Normal pulses.     Heart sounds: Normal heart sounds.  Pulmonary:     Effort: Pulmonary effort is normal.     Breath sounds: Normal breath sounds.  Abdominal:     General: Abdomen is flat. Bowel sounds are normal.     Palpations: Abdomen is soft.  Musculoskeletal:        General: Normal range of motion.     Cervical back: Normal range of motion and neck supple.  Skin:    General: Skin is warm.     Capillary Refill: Capillary refill takes less than 2 seconds.  Neurological:     General: No focal deficit present.     Mental Status: He is alert and oriented to person,  place, and time.  Psychiatric:        Mood and Affect: Mood normal.        Behavior: Behavior normal.     ED Results / Procedures / Treatments   Labs (all labs ordered are listed, but only abnormal results are displayed) Labs Reviewed  RESP PANEL BY RT-PCR (RSV, FLU A&B, COVID)  [id number] - Abnormal; Notable for the following components:      Result Value   Influenza A by PCR POSITIVE (*)    All other components within normal limits    EKG None  Radiology DG Chest 2 View Result Date: 01/14/2024 CLINICAL DATA:  Cough for 1 week with fever. EXAM: CHEST - 2 VIEW COMPARISON:  Radiographs 11/06/2022 and 08/22/2021. FINDINGS: The heart size and mediastinal contours are stable. The lungs appear clear. There are possible new small pleural effusions posteriorly on the lateral view. No pneumothorax. Stable degenerative changes in the spine associated with a mild thoracic  scoliosis. IMPRESSION: Possible new small pleural effusions posteriorly on the lateral view. No other evidence of acute cardiopulmonary process. Electronically Signed   By: Carey Bullocks M.D.   On: 01/14/2024 10:18    Procedures Procedures    Medications Ordered in ED Medications - No data to display  ED Course/ Medical Decision Making/ A&P                                 Medical Decision Making Amount and/or Complexity of Data Reviewed Radiology: ordered.  Risk Prescription drug management.   This patient presents to the ED for concern of cough, this involves an extensive number of treatment options, and is a complaint that carries with it a high risk of complications and morbidity.  The differential diagnosis includes covid/flu/rsv, uri, pna, bronchitis   Co morbidities that complicate the patient evaluation  none   Additional history obtained:  Additional history obtained from epic chart review  Lab Tests:  I Ordered, and personally interpreted labs.  The pertinent results include:  covid/rsv neg; influenza a +   Imaging Studies ordered:  I ordered imaging studies including cxr  I independently visualized and interpreted imaging which showed  Possible new small pleural effusions posteriorly on the lateral  view. No other evidence of acute cardiopulmonary process.   I agree with the radiologist interpretation  Medicines ordered and prescription drug management:   I have reviewed the patients home medicines and have made adjustments as needed  Problem List / ED Course:  Influenza A:  pt is not hypoxic and looks well.  He is d/c with tussionex.  He is out of the window for tamiflu.  He is to return if worse.  He does not have a pcp, so is given pcp info to f/u.   Reevaluation:  After the interventions noted above, I reevaluated the patient and found that they have :improved   Social Determinants of Health:  Lives at home   Dispostion:  After  consideration of the diagnostic results and the patients response to treatment, I feel that the patent would benefit from discharge with outpatient f/u.          Final Clinical Impression(s) / ED Diagnoses Final diagnoses:  Influenza A    Rx / DC Orders ED Discharge Orders          Ordered    chlorpheniramine-HYDROcodone (TUSSIONEX) 10-8 MG/5ML  Every 12 hours PRN  01/14/24 1023              Jacalyn Lefevre, MD 01/14/24 1025

## 2024-01-14 NOTE — ED Triage Notes (Signed)
 Pt c/o cough x 1 week with fever

## 2024-09-17 ENCOUNTER — Emergency Department (HOSPITAL_COMMUNITY)

## 2024-09-17 ENCOUNTER — Other Ambulatory Visit: Payer: Self-pay

## 2024-09-17 ENCOUNTER — Emergency Department (HOSPITAL_COMMUNITY)
Admission: EM | Admit: 2024-09-17 | Discharge: 2024-09-17 | Disposition: A | Discharge status: Home / Self Care | Attending: Emergency Medicine | Admitting: Emergency Medicine

## 2024-09-17 DIAGNOSIS — M542 Cervicalgia: Secondary | ICD-10-CM | POA: Diagnosis not present

## 2024-09-17 DIAGNOSIS — M545 Low back pain, unspecified: Secondary | ICD-10-CM | POA: Diagnosis not present

## 2024-09-17 DIAGNOSIS — W108XXA Fall (on) (from) other stairs and steps, initial encounter: Secondary | ICD-10-CM | POA: Insufficient documentation

## 2024-09-17 DIAGNOSIS — R519 Headache, unspecified: Secondary | ICD-10-CM | POA: Insufficient documentation

## 2024-09-17 DIAGNOSIS — W19XXXA Unspecified fall, initial encounter: Secondary | ICD-10-CM

## 2024-09-17 DIAGNOSIS — M25551 Pain in right hip: Secondary | ICD-10-CM | POA: Insufficient documentation

## 2024-09-17 MED ORDER — OXYCODONE-ACETAMINOPHEN 5-325 MG PO TABS
1.0000 | ORAL_TABLET | Freq: Once | ORAL | Status: AC
  Administered 2024-09-17: 1 via ORAL
  Filled 2024-09-17: qty 1

## 2024-09-17 MED ORDER — ONDANSETRON 4 MG PO TBDP
4.0000 mg | ORAL_TABLET | Freq: Once | ORAL | Status: AC
  Administered 2024-09-17: 4 mg via ORAL
  Filled 2024-09-17: qty 1

## 2024-09-17 MED ORDER — METHOCARBAMOL 500 MG PO TABS
500.0000 mg | ORAL_TABLET | Freq: Two times a day (BID) | ORAL | 0 refills | Status: AC
Start: 2024-09-17 — End: ?

## 2024-09-17 NOTE — ED Triage Notes (Signed)
 Patient BIB EMS from home c/o fall. Patient stated he slipped and landed on his lower back in the concrete and hit the back of his head in the steps. Patient denies LOC. Patient denies thinners. Patient c/o 8/10 lower back pain. C-Colar applied by EMS.  BP 150/90 HR 75 RR 20 O2sat 99% on RA

## 2024-09-17 NOTE — ED Notes (Signed)
 Assisted pt to and from restroom in wheelchair.

## 2024-09-17 NOTE — ED Provider Notes (Signed)
 [place] EMERGENCY DEPARTMENT AT [facility] Provider Note   CSN: [id number] Arrival date & time: 09/17/24  1705     Patient presents with: Victor King is a 43 y.o. male.  Patient presents the emergency department today with concerns of fall.  Endorsing pain to the lower back, neck, and head.  Denies loss of consciousness.  Not on blood thinners.  Currently rates low back pain at 8 out of 10.  She said he slipped when he tried to step down the outside steps of his home.  He landed on his low back.  Has been able to bear weight since the fall but endorses pain primarily to the right hip.   Fall       Prior to Admission medications   Medication Sig Start Date End Date Taking? Authorizing Provider  methocarbamol (ROBAXIN) 500 MG tablet Take 1 tablet (500 mg total) by mouth 2 (two) times daily. 09/17/24  Yes Lasundra Hascall A, PA-C  chlorpheniramine-HYDROcodone  (TUSSIONEX) 10-8 MG/5ML Take 5 mLs by mouth every 12 (twelve) hours as needed for cough. 01/14/24   Dean Clarity, MD  montelukast  (SINGULAIR ) 10 MG tablet Take 1 tablet (10 mg total) by mouth at bedtime. Patient not taking: Reported on 11/07/2022 05/30/21   Early, Sara E, NP  naproxen  (NAPROSYN ) 500 MG tablet Take 1 tablet (500 mg total) by mouth 2 (two) times daily. 09/03/23   Geiple, Joshua, PA-C  oxyCODONE -acetaminophen  (PERCOCET/ROXICET) 5-325 MG tablet Take 1 tablet by mouth every 6 (six) hours as needed for severe pain. 09/03/23   Geiple, Joshua, PA-C  pantoprazole  (PROTONIX ) 40 MG tablet Take 1 tablet (40 mg total) by mouth daily. Patient not taking: Reported on 11/07/2022 05/30/21   Early, Sara E, NP  valACYclovir  (VALTREX ) 1000 MG tablet Take 1 tablet (1,000 mg total) by mouth 3 (three) times daily. 09/03/23   Geiple, Joshua, PA-C    Allergies: Patient has no known allergies.    Review of Systems  Musculoskeletal:  Positive for back pain.  All other systems reviewed and are  negative.   Updated Vital Signs BP (!) 148/81 (BP Location: Left Arm)   Pulse 69   Temp 97.9 F (36.6 C) (Oral)   Resp 17   SpO2 98%   Physical Exam Vitals and nursing note reviewed.  Constitutional:      General: He is not in acute distress.    Appearance: He is well-developed.  HENT:     Head: Normocephalic and atraumatic.  Eyes:     Conjunctiva/sclera: Conjunctivae normal.  Cardiovascular:     Rate and Rhythm: Normal rate and regular rhythm.     Heart sounds: No murmur heard. Pulmonary:     Effort: Pulmonary effort is normal. No respiratory distress.     Breath sounds: Normal breath sounds. No wheezing or rales.  Abdominal:     General: Abdomen is flat. Bowel sounds are normal. There is no distension.     Palpations: Abdomen is soft.     Tenderness: There is no abdominal tenderness. There is no guarding.  Musculoskeletal:        General: Tenderness and signs of injury present. No swelling or deformity.     Cervical back: Neck supple.       Legs:     Comments: TTP along the right lateral hip. No deformities noted. Difficulty with movement of the hip due to pain.  Skin:    General: Skin is warm and dry.  Capillary Refill: Capillary refill takes less than 2 seconds.  Neurological:     Mental Status: He is alert.  Psychiatric:        Mood and Affect: Mood normal.     (all labs ordered are listed, but only abnormal results are displayed) Labs Reviewed - No data to display  EKG: None  Radiology: CT Head Wo Contrast Result Date: 09/17/2024 EXAM: CT HEAD WITHOUT CONTRAST 09/17/2024 07:23:49 PM TECHNIQUE: CT of the head was performed without the administration of intravenous contrast. Automated exposure control, iterative reconstruction, and/or weight based adjustment of the mA/kV was utilized to reduce the radiation dose to as low as reasonably achievable. COMPARISON: None available. CLINICAL HISTORY: Head trauma, moderate-severe. Fall, head/neck trauma. FINDINGS:  BRAIN AND VENTRICLES: No acute hemorrhage. No evidence of acute infarct. No hydrocephalus. No extra-axial collection. No mass effect or midline shift. ORBITS: No acute abnormality. SINUSES: No acute abnormality. SOFT TISSUES AND SKULL: No acute soft tissue abnormality. No skull fracture. CERVICAL SPINE: 2 mm anterolisthesis C4-C5 and C5-C6 is present, likely degenerative in nature. Disc space narrowing and endplate remodeling is seen at C6-C7 in keeping with changes of moderate-to-severe degenerative disc disease. Intraosseous multicystic lesion within the right C7-T1 facet joint spans both sides of the joint and likely represents an intraosseous synovial cyst or subchondral cyst. IMPRESSION: 1. No acute intracranial abnormality. 2. No acute fracture or listhesis of the cervical spine. 3. Disc space narrowing and endplate remodeling at C6-7, consistent with moderate-to-severe degenerative disc disease. Electronically signed by: Dorethia Molt MD 09/17/2024 07:38 PM EDT RP Workstation: [id number]   CT Cervical Spine Wo Contrast Result Date: 09/17/2024 EXAM: CT HEAD WITHOUT CONTRAST 09/17/2024 07:23:49 PM TECHNIQUE: CT of the head was performed without the administration of intravenous contrast. Automated exposure control, iterative reconstruction, and/or weight based adjustment of the mA/kV was utilized to reduce the radiation dose to as low as reasonably achievable. COMPARISON: None available. CLINICAL HISTORY: Head trauma, moderate-severe. Fall, head/neck trauma. FINDINGS: BRAIN AND VENTRICLES: No acute hemorrhage. No evidence of acute infarct. No hydrocephalus. No extra-axial collection. No mass effect or midline shift. ORBITS: No acute abnormality. SINUSES: No acute abnormality. SOFT TISSUES AND SKULL: No acute soft tissue abnormality. No skull fracture. CERVICAL SPINE: 2 mm anterolisthesis C4-C5 and C5-C6 is present, likely degenerative in nature. Disc space narrowing and endplate remodeling is seen at C6-C7  in keeping with changes of moderate-to-severe degenerative disc disease. Intraosseous multicystic lesion within the right C7-T1 facet joint spans both sides of the joint and likely represents an intraosseous synovial cyst or subchondral cyst. IMPRESSION: 1. No acute intracranial abnormality. 2. No acute fracture or listhesis of the cervical spine. 3. Disc space narrowing and endplate remodeling at C6-7, consistent with moderate-to-severe degenerative disc disease. Electronically signed by: Dorethia Molt MD 09/17/2024 07:38 PM EDT RP Workstation: [id number]   DG Lumbar Spine Complete Result Date: 09/17/2024 CLINICAL DATA:  Pain after fall. EXAM: LUMBAR SPINE - COMPLETE 4+ VIEW COMPARISON:  None Available. FINDINGS: Five non-rib-bearing lumbar vertebra. Trace retrolisthesis of L5 on S1. Otherwise normal alignment. No evidence of acute fracture or compression deformity. Mild L5-S1 disc space narrowing and spurring. The remaining disc spaces are preserved. No sacroiliac diastasis. IMPRESSION: 1. No fracture of the lumbar spine. 2. Mild L5-S1 degenerative disc disease. Electronically Signed   By: Andrea Gasman M.D.   On: 09/17/2024 18:17   DG Hip Unilat  With Pelvis 2-3 Views Right Result Date: 09/17/2024 CLINICAL DATA:  Pain after slip and fall. EXAM: DG  HIP (WITH OR WITHOUT PELVIS) 2-3V RIGHT COMPARISON:  Remote radiograph 10/25/2012 FINDINGS: No evidence of acute fracture of the pelvis or right hip. No hip dislocation. Hip joint spaces are preserved. Pubic rami are intact. No pubic symphyseal or sacroiliac diastasis. Question mild soft tissue edema laterally. IMPRESSION: No fracture or dislocation of the pelvis or right hip. Question mild soft tissue edema laterally. Electronically Signed   By: Andrea Gasman M.D.   On: 09/17/2024 18:14     Procedures   Medications Ordered in the ED  oxyCODONE -acetaminophen  (PERCOCET/ROXICET) 5-325 MG per tablet 1 tablet (1 tablet Oral Given 09/17/24 1829)   ondansetron  (ZOFRAN -ODT) disintegrating tablet 4 mg (4 mg Oral Given 09/17/24 1827)                                    Medical Decision Making Amount and/or Complexity of Data Reviewed Radiology: ordered.  Risk Prescription drug management.   This patient presents to the ED for concern of fall.  Differential diagnosis includes cervical strain, disc herniation, lumbar strain, head injury, concussion    Additional history obtained:  Additional history obtained from epic chart review   Imaging Studies ordered:  I ordered imaging studies including x-ray of the lumbar spine, right pelvis, CT head, CT cervical spine I independently visualized and interpreted imaging which showed negative for any acute findings or injuries, soft tissue swelling seen towards the right pelvis I agree with the radiologist interpretation   Medicines ordered and prescription drug management:  I ordered medication including Percocet, Zofran  for pain, nausea Reevaluation of the patient after these medicines showed that the patient improved I have reviewed the patients home medicines and have made adjustments as needed   Problem List / ED Course:  Patient presented to the emergency department with concerns of mechanical fall.  Reportedly slipped down a few steps and landed on his low back on concrete.  Dors is pain to the right low back, head but denies obvious head impact or head strike.  No reported loss of consciousness.  He is not on any blood thinners. On exam, patient has tenderness to palpation along the right lateral hip.  There is some swelling of this area but no bruising or deformity noted.  Will attain imaging for the low back and hip for assessment.  Given patient is currently in a c-collar and reports possible head impact, will obtain imaging of the head and neck for further assessment.  Will reevaluate shortly.  Percocet and Zofran  given for symptom control. Imaging is unremarkable.   Patient reports improvement after Percocet and Zofran .  Patient was able to ambulate without assistance although still endorsing some pain to the right hip.  Given patient tolerating weightbearing, do not feel that he requires more advanced imaging at this time such as CT imaging.  Advised patient return to the emergency department if he continues to have more progressive or worsening symptoms.  Otherwise encourage close follow-up with his primary care provider for evaluation as needed.  He is otherwise stable for outpatient follow-up and discharged home.   Social Determinants of Health:  None  Final diagnoses:  Fall, initial encounter  Right hip pain    ED Discharge Orders          Ordered    methocarbamol (ROBAXIN) 500 MG tablet  2 times daily        09/17/24 2056  Juel Bellerose A, PA-C 09/17/24 2245    Tegeler, Lonni PARAS, MD 09/17/24 [phone]

## 2024-09-17 NOTE — ED Notes (Signed)
 Patient d/c'd with home care instructions. Patient provided with a bus pass.

## 2024-09-17 NOTE — ED Notes (Signed)
 Pt ambulated successfully but did complain of pain 7/10 for right hip.

## 2024-09-17 NOTE — Discharge Instructions (Addendum)
 You were seen in the ER today for concerns of a fall.  Your images of your head, neck, hip, and low back were normal with no signs of any fracture or other injury.  I would recommend taking anti-inflammatory medications like ibuprofen  or naproxen  for pain control.  I have sent a prescription for Robaxin to your pharmacy for further control of your symptoms.  Return to the emergency department for any concerns of new or worsening symptoms.  Otherwise, please follow-up with your primary care provider.
